# Patient Record
Sex: Female | Born: 1965 | Race: White | Hispanic: No | Marital: Single | Smoking: Never smoker
Health system: Southern US, Community
[De-identification: ages and names within clinical notes are randomized; demographics above are authoritative.]

## PROBLEM LIST (undated history)

## (undated) DIAGNOSIS — D649 Anemia, unspecified: Secondary | ICD-10-CM

## (undated) DIAGNOSIS — E8881 Metabolic syndrome: Secondary | ICD-10-CM

## (undated) DIAGNOSIS — E88819 Insulin resistance, unspecified: Secondary | ICD-10-CM

## (undated) DIAGNOSIS — A6920 Lyme disease, unspecified: Secondary | ICD-10-CM

## (undated) DIAGNOSIS — E785 Hyperlipidemia, unspecified: Secondary | ICD-10-CM

## (undated) DIAGNOSIS — F419 Anxiety disorder, unspecified: Secondary | ICD-10-CM

## (undated) DIAGNOSIS — E559 Vitamin D deficiency, unspecified: Secondary | ICD-10-CM

## (undated) HISTORY — DX: Metabolic syndrome: E88.81

## (undated) HISTORY — DX: Anemia, unspecified: D64.9

## (undated) HISTORY — DX: Vitamin D deficiency, unspecified: E55.9

## (undated) HISTORY — DX: Anxiety disorder, unspecified: F41.9

## (undated) HISTORY — DX: Hyperlipidemia, unspecified: E78.5

## (undated) HISTORY — PX: THERAPEUTIC ABORTION: SHX798

## (undated) HISTORY — DX: Insulin resistance, unspecified: E88.819

## (undated) HISTORY — DX: Lyme disease, unspecified: A69.20

---

## 1999-07-17 ENCOUNTER — Other Ambulatory Visit: Admission: RE | Admit: 1999-07-17 | Discharge: 1999-07-17 | Payer: Self-pay | Admitting: Obstetrics and Gynecology

## 2000-07-20 ENCOUNTER — Other Ambulatory Visit: Admission: RE | Admit: 2000-07-20 | Discharge: 2000-07-20 | Payer: Self-pay | Admitting: Obstetrics and Gynecology

## 2001-07-30 ENCOUNTER — Other Ambulatory Visit: Admission: RE | Admit: 2001-07-30 | Discharge: 2001-07-30 | Payer: Self-pay | Admitting: Obstetrics and Gynecology

## 2002-08-18 ENCOUNTER — Other Ambulatory Visit: Admission: RE | Admit: 2002-08-18 | Discharge: 2002-08-18 | Payer: Self-pay | Admitting: Obstetrics and Gynecology

## 2004-01-03 ENCOUNTER — Other Ambulatory Visit: Admission: RE | Admit: 2004-01-03 | Discharge: 2004-01-03 | Payer: Self-pay | Admitting: Obstetrics and Gynecology

## 2005-06-12 ENCOUNTER — Other Ambulatory Visit: Admission: RE | Admit: 2005-06-12 | Discharge: 2005-06-12 | Payer: Self-pay | Admitting: Obstetrics and Gynecology

## 2006-09-18 ENCOUNTER — Other Ambulatory Visit: Admission: RE | Admit: 2006-09-18 | Discharge: 2006-09-18 | Payer: Self-pay | Admitting: Obstetrics and Gynecology

## 2007-10-25 ENCOUNTER — Other Ambulatory Visit: Admission: RE | Admit: 2007-10-25 | Discharge: 2007-10-25 | Payer: Self-pay | Admitting: Obstetrics and Gynecology

## 2010-01-23 ENCOUNTER — Ambulatory Visit: Payer: Self-pay | Admitting: Women's Health

## 2010-02-07 ENCOUNTER — Ambulatory Visit: Payer: Self-pay | Admitting: Women's Health

## 2010-02-07 ENCOUNTER — Other Ambulatory Visit: Admission: RE | Admit: 2010-02-07 | Discharge: 2010-02-07 | Payer: Self-pay | Admitting: Obstetrics and Gynecology

## 2010-05-22 ENCOUNTER — Ambulatory Visit
Admission: RE | Admit: 2010-05-22 | Discharge: 2010-05-22 | Payer: Self-pay | Source: Home / Self Care | Attending: Women's Health | Admitting: Women's Health

## 2010-12-11 ENCOUNTER — Other Ambulatory Visit: Payer: Self-pay | Admitting: Internal Medicine

## 2010-12-11 ENCOUNTER — Ambulatory Visit (INDEPENDENT_AMBULATORY_CARE_PROVIDER_SITE_OTHER): Payer: BC Managed Care – PPO | Admitting: Internal Medicine

## 2010-12-11 ENCOUNTER — Encounter: Payer: Self-pay | Admitting: Internal Medicine

## 2010-12-11 VITALS — BP 130/82 | HR 76 | Temp 98.1°F | Ht 66.0 in | Wt 132.0 lb

## 2010-12-11 DIAGNOSIS — E8881 Metabolic syndrome: Secondary | ICD-10-CM | POA: Insufficient documentation

## 2010-12-11 DIAGNOSIS — R5383 Other fatigue: Secondary | ICD-10-CM

## 2010-12-11 DIAGNOSIS — L659 Nonscarring hair loss, unspecified: Secondary | ICD-10-CM

## 2010-12-11 DIAGNOSIS — F419 Anxiety disorder, unspecified: Secondary | ICD-10-CM | POA: Insufficient documentation

## 2010-12-11 DIAGNOSIS — F411 Generalized anxiety disorder: Secondary | ICD-10-CM

## 2010-12-11 DIAGNOSIS — D649 Anemia, unspecified: Secondary | ICD-10-CM | POA: Insufficient documentation

## 2010-12-11 DIAGNOSIS — N926 Irregular menstruation, unspecified: Secondary | ICD-10-CM

## 2010-12-11 DIAGNOSIS — E785 Hyperlipidemia, unspecified: Secondary | ICD-10-CM | POA: Insufficient documentation

## 2010-12-11 LAB — CBC WITH DIFFERENTIAL/PLATELET
Basophils Relative: 0 % (ref 0–1)
HCT: 43.3 % (ref 36.0–46.0)
Hemoglobin: 13.9 g/dL (ref 12.0–15.0)
Lymphocytes Relative: 18 % (ref 12–46)
Lymphs Abs: 1.4 10*3/uL (ref 0.7–4.0)
MCHC: 32.1 g/dL (ref 30.0–36.0)
Monocytes Absolute: 0.6 10*3/uL (ref 0.1–1.0)
Monocytes Relative: 7 % (ref 3–12)
Neutro Abs: 5.9 10*3/uL (ref 1.7–7.7)
Neutrophils Relative %: 73 % (ref 43–77)
RBC: 4.63 MIL/uL (ref 3.87–5.11)
WBC: 8 10*3/uL (ref 4.0–10.5)

## 2010-12-11 LAB — COMPREHENSIVE METABOLIC PANEL
AST: 21 U/L (ref 0–37)
Albumin: 4.6 g/dL (ref 3.5–5.2)
Alkaline Phosphatase: 55 U/L (ref 39–117)
Calcium: 9.5 mg/dL (ref 8.4–10.5)
Chloride: 103 mEq/L (ref 96–112)
Glucose, Bld: 84 mg/dL (ref 70–99)
Potassium: 4.2 mEq/L (ref 3.5–5.3)
Sodium: 140 mEq/L (ref 135–145)
Total Protein: 7.6 g/dL (ref 6.0–8.3)

## 2010-12-11 LAB — T4, FREE: Free T4: 1.21 ng/dL (ref 0.80–1.80)

## 2010-12-11 LAB — T3, FREE: T3, Free: 3.3 pg/mL (ref 2.3–4.2)

## 2010-12-11 NOTE — Progress Notes (Signed)
Subjective:    Patient ID: Andrea Francis, female    DOB: [birth year], 45 y.o.   MRN: [id number]  HPI  New pt for first visit.  I spent 45 minutes with this pt.  Former pt of Dr. Benjamine Mola??? Deboraha Sprang on [address] and has seen Harriett Sine young at [facility] and has seen Dr. Sharl Ma at [facility].  Pt has multiple  longstanding vague symptoms but the predominant complaint is that "I shouldn't have this much fatigue"  She denies muscle discomfort .  She was evaluated by an MD in [place] approx 5 years ago and told she had insulin resistance, treated with Metformin but could not tolerate medication.  She has had hair loss off and on, irregular menses off and on, no rashes.  Menses have been controlled with Junel by Maryelizabeth Rowan    Her mother was recently diagnosed with Lupus she reports.  She has taken herself off of gluten foods for the past 4 months and states she feels better.  She denies GI symptoms with dairy.  She is on Xanax for anxiety and has occ depression.  She denies suicidality.  Sleep is fine  She lives alone with a "bunch of redneck neighbors"  She has had 2 pregnancies which were terminated for reasons "I do not wish to go into".  Weight has been stable of late.  She is in sales Allergies  Allergen Reactions  . Wellbutrin (Bupropion Hcl) Hives   Past Medical History  Diagnosis Date  . Insulin resistance   . Anemia     per pt report  . Anxiety   . Hyperlipidemia    Past Surgical History  Procedure Date  . Therapeutic abortion     x2   History   Social History  . Marital Status: Single    Spouse Name: N/A    Number of Children: N/A  . Years of Education: N/A   Occupational History  . Not on file.   Social History Main Topics  . Smoking status: Never Smoker   . Smokeless tobacco: Never Used  . Alcohol Use: 2.5 oz/week    5 drink(s) per week  . Drug Use: No  . Sexually Active: Yes   Other Topics Concern  . Not on file   Social History Narrative  . No narrative on  file   Family History  Problem Relation Age of Onset  . Thyroid disease Mother   . Lupus Mother   . Arthritis Mother     rheumatoid  . Insulin resistance Mother   . Cancer Father     kidney   Patient Active Problem List  Diagnoses  . Anemia  . Anxiety  . Hyperlipidemia  . Insulin resistance        Review of Systems  See hpi     Objective:   Physical Exam  Constitutional: She appears well-developed and well-nourished.  Neck: Neck supple. No thyromegaly present.  Cardiovascular: Normal heart sounds.  Exam reveals no gallop and no friction rub.   No murmur heard. Pulmonary/Chest: She has no wheezes. She has no rales. She exhibits no tenderness.  Musculoskeletal: She exhibits no edema.  Lymphadenopathy:    She has no cervical adenopathy.  Skin: Skin is warm and dry. No rash noted.  Psychiatric: She has a normal mood and affect. Her behavior is normal.          Assessment & Plan:  1)Fatigue  2)  Hair loss  3)  Inrregular menses  Will  refer to Dr. Eustaquio Boyden  4)  Possible gluten intolerance  Symptom complex is longstanding.  With FH of Lupus will check ANA, RF, CBC, Chem 24, TSH, Free t3, Free T4, Vit b12 and lipids  Pt to return in 2 weeks

## 2010-12-11 NOTE — Patient Instructions (Signed)
Continue gluten free diet.  

## 2010-12-12 ENCOUNTER — Encounter: Payer: Self-pay | Admitting: Emergency Medicine

## 2010-12-12 LAB — GLIADIN ANTIBODIES, SERUM: Gliadin IgG: 25.6 U/mL — ABNORMAL HIGH (ref <20)

## 2010-12-12 LAB — TISSUE TRANSGLUTAMINASE, IGA: Tissue Transglutaminase Ab, IgA: 5.5 U/mL (ref <20)

## 2010-12-13 LAB — RETICULIN ANTIBODIES, IGA W TITER

## 2010-12-25 ENCOUNTER — Ambulatory Visit (INDEPENDENT_AMBULATORY_CARE_PROVIDER_SITE_OTHER): Payer: BC Managed Care – PPO | Admitting: Internal Medicine

## 2010-12-25 ENCOUNTER — Encounter: Payer: Self-pay | Admitting: Internal Medicine

## 2010-12-25 VITALS — BP 112/69 | HR 80 | Temp 97.5°F | Wt 134.0 lb

## 2010-12-25 DIAGNOSIS — R5383 Other fatigue: Secondary | ICD-10-CM

## 2010-12-25 DIAGNOSIS — F064 Anxiety disorder due to known physiological condition: Secondary | ICD-10-CM

## 2010-12-25 MED ORDER — ALPRAZOLAM 0.25 MG PO TABS: 0.2500 mg | ORAL_TABLET | Freq: Every evening | ORAL | Status: DC | PRN

## 2010-12-25 NOTE — Progress Notes (Signed)
  Subjective:    Patient ID: Andrea Francis, female    DOB: [birth year], 45 y.o.   MRN: [id number]  HPI  Andrea Francis is here to follow up on her fatigue.  See labs  All normal except slight elevation of Gliadin IGg.  She has been on a gluten free diet for the last 6-7 weeks and states she is having more good days lately.  Work has been stressful.  All other labs are normal  Allergies  Allergen Reactions  . Wellbutrin (Bupropion Hcl) Hives   Past Medical History  Diagnosis Date  . Insulin resistance   . Anemia     per pt report  . Anxiety   . Hyperlipidemia    Past Surgical History  Procedure Date  . Therapeutic abortion     x2   History   Social History  . Marital Status: Single    Spouse Name: N/A    Number of Children: N/A  . Years of Education: N/A   Occupational History  . Not on file.   Social History Main Topics  . Smoking status: Never Smoker   . Smokeless tobacco: Never Used  . Alcohol Use: 2.5 oz/week    5 drink(s) per week  . Drug Use: No  . Sexually Active: Yes   Other Topics Concern  . Not on file   Social History Narrative  . No narrative on file   Family History  Problem Relation Age of Onset  . Thyroid disease Mother   . Lupus Mother   . Arthritis Mother     rheumatoid  . Insulin resistance Mother   . Cancer Father     kidney   Patient Active Problem List  Diagnoses  . Anemia  . Anxiety  . Hyperlipidemia  . Insulin resistance   Current Outpatient Prescriptions on File Prior to Visit  Medication Sig Dispense Refill  . norethindrone-ethinyl estradiol (JUNEL FE 1/20) 1-20 MG-MCG per tablet Take 1 tablet by mouth daily.             Review of Systems  See HPI     Objective:   Physical Exam Physical Exam  Nursing note and vitals reviewed.  Constitutional: She is oriented to person, place, and time. She appears well-developed and well-nourished.  HENT:  Head: Normocephalic and atraumatic.  Cardiovascular: Normal rate and  regular rhythm. Exam reveals no gallop and no friction rub.  No murmur heard.  Pulmonary/Chest: Breath sounds normal. She has no wheezes. She has no rales.  Neurological: She is alert and oriented to person, place, and time.  Skin: Skin is warm and dry.  Psychiatric: She has a normal mood and affect. Her behavior is normal.             Assessment & Plan:  1)  Fatigue  Difficult to pinpoint.  I explained gold standard dx for celiac disease is confimend by colon biopsy when she has been on a gluten diet.  She believes that gluten free diet has been making her feel much better and will continue  2)  Anxiety:  Will refill Xanax 0.25 mg # 10  Retrun prn

## 2011-01-01 ENCOUNTER — Encounter: Payer: Self-pay | Admitting: Internal Medicine

## 2011-01-13 ENCOUNTER — Ambulatory Visit (INDEPENDENT_AMBULATORY_CARE_PROVIDER_SITE_OTHER): Payer: BC Managed Care – PPO | Admitting: Internal Medicine

## 2011-01-13 ENCOUNTER — Encounter: Payer: Self-pay | Admitting: Internal Medicine

## 2011-01-13 VITALS — BP 114/73 | HR 73 | Temp 97.7°F | Resp 16 | Ht 66.0 in | Wt 137.0 lb

## 2011-01-13 DIAGNOSIS — J329 Chronic sinusitis, unspecified: Secondary | ICD-10-CM

## 2011-01-13 MED ORDER — AZITHROMYCIN 250 MG PO TABS: ORAL_TABLET | ORAL | Status: AC

## 2011-01-13 NOTE — Patient Instructions (Signed)
Take med after 48 hours

## 2011-01-13 NOTE — Progress Notes (Signed)
Subjective:    Patient ID: Andrea Francis, female    DOB: [birth year], 45 y.o.   MRN: [id number]  HPI  Andrea Francis is here because she feels she has sinusitis.  [place] and states she has nasal congestion, ear pressure L.> R, and swollen lymph node in neck.  No fever, no cough no lchest paiin  Allergies  Allergen Reactions  . Wellbutrin (Bupropion Hcl) Hives   Past Medical History  Diagnosis Date  . Insulin resistance   . Anemia     per pt report  . Anxiety   . Hyperlipidemia    Past Surgical History  Procedure Date  . Therapeutic abortion     x2   History   Social History  . Marital Status: Single    Spouse Name: N/A    Number of Children: N/A  . Years of Education: N/A   Occupational History  . Not on file.   Social History Main Topics  . Smoking status: Never Smoker   . Smokeless tobacco: Never Used  . Alcohol Use: 2.5 oz/week    5 drink(s) per week  . Drug Use: No  . Sexually Active: Yes   Other Topics Concern  . Not on file   Social History Narrative  . No narrative on file   Family History  Problem Relation Age of Onset  . Thyroid disease Mother   . Lupus Mother   . Arthritis Mother     rheumatoid  . Insulin resistance Mother   . Cancer Father     kidney   Patient Active Problem List  Diagnoses  . Anemia  . Anxiety  . Hyperlipidemia  . Insulin resistance   Current Outpatient Prescriptions on File Prior to Visit  Medication Sig Dispense Refill  . ALPRAZolam (XANAX) 0.25 MG tablet Take 1 tablet (0.25 mg total) by mouth at bedtime as needed.  10 tablet  0  . norethindrone-ethinyl estradiol (JUNEL FE 1/20) 1-20 MG-MCG per tablet Take 1 tablet by mouth daily.                Review of Systems See HPI    Objective:   Physical Exam Physical Exam  Constitutional: She is oriented to person, place, and time. She appears well-developed and well-nourished. She is cooperative.  HENT:  Head: Normocephalic and  atraumatic.  Right Ear: A middle ear effusion is present.  Left Ear: A middle ear effusion is present.  Nose: Mucosal edema present. Right sinus exhibits maxillary sinus tenderness. Left sinus exhibits maxillary sinus tenderness.  Mouth/Throat: Posterior oropharyngeal erythema present.  Serous effusion bilaterally  Eyes: Conjunctivae and EOM are normal. Pupils are equal, round, and reactive to light.  Neck: Neck supple. Carotid bruit is not present. No mass present.  Cardiovascular: Regular rhythm, normal heart sounds, intact distal pulses and normal pulses. Exam reveals no gallop and no friction rub.  No murmur heard.  Pulmonary/Chest: Breath sounds normal. She has no wheezes. She has no rhonchi. She has no rales.  Neurological: She is alert and oriented to person, place, and time.  Skin: Skin is warm and dry. No abrasion, no bruising, no ecchymosis and no rash noted. No cyanosis. Nails show no clubbing.  Psychiatric: She has a normal mood and affect. Her speech is normal and behavior is normal.         Assessment & Plan:  1) probable sinusitis  This may be viral but will give Z pack and pt instructed to wait  48 hrs before filling .   Delsym prn if developes a cough. Advil prn for [employer]

## 2011-03-18 ENCOUNTER — Other Ambulatory Visit: Payer: Self-pay | Admitting: Unknown Physician Specialty

## 2011-03-18 DIAGNOSIS — R1013 Epigastric pain: Secondary | ICD-10-CM

## 2011-03-20 ENCOUNTER — Ambulatory Visit
Admission: RE | Admit: 2011-03-20 | Discharge: 2011-03-20 | Disposition: A | Discharge status: Home / Self Care | Payer: BC Managed Care – PPO | Source: Ambulatory Visit | Attending: Unknown Physician Specialty | Admitting: Unknown Physician Specialty

## 2011-03-20 ENCOUNTER — Other Ambulatory Visit: Payer: BC Managed Care – PPO

## 2011-03-20 DIAGNOSIS — R1013 Epigastric pain: Secondary | ICD-10-CM

## 2011-04-13 ENCOUNTER — Encounter: Payer: Self-pay | Admitting: Internal Medicine

## 2011-06-02 ENCOUNTER — Encounter: Payer: Self-pay | Admitting: Internal Medicine

## 2011-06-02 DIAGNOSIS — K219 Gastro-esophageal reflux disease without esophagitis: Secondary | ICD-10-CM | POA: Insufficient documentation

## 2012-01-06 ENCOUNTER — Encounter: Payer: Self-pay | Admitting: Internal Medicine

## 2012-01-06 ENCOUNTER — Ambulatory Visit (INDEPENDENT_AMBULATORY_CARE_PROVIDER_SITE_OTHER): Payer: BC Managed Care – PPO | Admitting: Internal Medicine

## 2012-01-06 VITALS — BP 105/72 | HR 72 | Temp 97.5°F | Resp 16 | Ht 66.0 in | Wt 142.0 lb

## 2012-01-06 DIAGNOSIS — J329 Chronic sinusitis, unspecified: Secondary | ICD-10-CM

## 2012-01-06 DIAGNOSIS — F411 Generalized anxiety disorder: Secondary | ICD-10-CM

## 2012-01-06 DIAGNOSIS — F419 Anxiety disorder, unspecified: Secondary | ICD-10-CM

## 2012-01-06 MED ORDER — ALPRAZOLAM 0.25 MG PO TABS: 0.2500 mg | ORAL_TABLET | Freq: Every evening | ORAL | Status: DC | PRN

## 2012-01-06 MED ORDER — AZITHROMYCIN 250 MG PO TABS: ORAL_TABLET | ORAL | Status: AC

## 2012-01-06 NOTE — Progress Notes (Signed)
Subjective:    Patient ID: Andrea Francis, female    DOB: [birth year], 46 y.o.   MRN: [id number]  HPI Andrea Francis is here for acute visit.  1 week facial sinus pain.  Occasionally green nasal discharge.  No documented fever no cough or chest pain.  Occasional headache  Xanax helps with anxiety.  Uses rarely  Allergies  Allergen Reactions  . Wellbutrin (Bupropion Hcl) Hives  . Terbinafine Hcl Rash   Past Medical History  Diagnosis Date  . Insulin resistance   . Anemia     per pt report  . Anxiety   . Hyperlipidemia    Past Surgical History  Procedure Date  . Therapeutic abortion     x2   History   Social History  . Marital Status: Single    Spouse Name: N/A    Number of Children: N/A  . Years of Education: N/A   Occupational History  . Not on file.   Social History Main Topics  . Smoking status: Never Smoker   . Smokeless tobacco: Never Used  . Alcohol Use: 2.5 oz/week    5 drink(s) per week  . Drug Use: No  . Sexually Active: Yes   Other Topics Concern  . Not on file   Social History Narrative  . No narrative on file   Family History  Problem Relation Age of Onset  . Thyroid disease Mother   . Lupus Mother   . Arthritis Mother     rheumatoid  . Insulin resistance Mother   . Cancer Father     kidney   Patient Active Problem List  Diagnosis  . Anemia  . Anxiety  . Hyperlipidemia  . Insulin resistance  . GERD (gastroesophageal reflux disease)   Current Outpatient Prescriptions on File Prior to Visit  Medication Sig Dispense Refill  . ALPRAZolam (XANAX) 0.25 MG tablet Take 1 tablet (0.25 mg total) by mouth at bedtime as needed.  10 tablet  0  . fexofenadine-pseudoephedrine (ALLEGRA-D 24) 180-240 MG per 24 hr tablet Take 1 tablet by mouth daily.           Review of Systems    see HPI Objective:   Physical Exam Physical Exam  Constitutional: She is oriented to person, place, and time. She appears well-developed and well-nourished. She is  cooperative.  HENT:  Head: Normocephalic and atraumatic.  Right Ear: A middle ear effusion is present.  Left Ear: A middle ear effusion is present.  Nose: Mucosal edema present. Right sinus exhibits maxillary sinus tenderness. Left sinus exhibits maxillary sinus tenderness.  Mouth/Throat: Posterior oropharyngeal erythema present.  Serous effusion bilaterally  Eyes: Conjunctivae and EOM are normal. Pupils are equal, round, and reactive to light.  Neck: Neck supple. Carotid bruit is not present. No mass present.  Cardiovascular: Regular rhythm, normal heart sounds, intact distal pulses and normal pulses. Exam reveals no gallop and no friction rub.  No murmur heard.  Pulmonary/Chest: Breath sounds normal. She has no wheezes. She has no rhonchi. She has no rales.  Neurological: She is alert and oriented to person, place, and time.  Skin: Skin is warm and dry. No abrasion, no bruising, no ecchymosis and no rash noted. No cyanosis. Nails show no clubbing.  Psychiatric: She has a normal mood and affect. Her speech is normal and behavior is normal.             Assessment & Plan:  Sinusitis:  Will give Z-pak  Ok for OTC Afrin for 4  days  Anxiety  Refill Xanax 0.25 mg # 10

## 2012-01-06 NOTE — Patient Instructions (Addendum)
See me if not better 

## 2012-02-23 ENCOUNTER — Ambulatory Visit (INDEPENDENT_AMBULATORY_CARE_PROVIDER_SITE_OTHER): Payer: BC Managed Care – PPO | Admitting: Internal Medicine

## 2012-02-23 ENCOUNTER — Encounter: Payer: Self-pay | Admitting: Internal Medicine

## 2012-02-23 VITALS — BP 120/85 | HR 72 | Temp 97.6°F | Resp 18 | Wt 133.0 lb

## 2012-02-23 DIAGNOSIS — L659 Nonscarring hair loss, unspecified: Secondary | ICD-10-CM

## 2012-02-23 DIAGNOSIS — J329 Chronic sinusitis, unspecified: Secondary | ICD-10-CM

## 2012-02-23 DIAGNOSIS — Z0000 Encounter for general adult medical examination without abnormal findings: Secondary | ICD-10-CM

## 2012-02-23 LAB — POCT URINALYSIS DIPSTICK
Bilirubin, UA: NEGATIVE
Glucose, UA: NEGATIVE
Ketones, UA: NEGATIVE
Leukocytes, UA: NEGATIVE

## 2012-02-23 MED ORDER — AMOXICILLIN-POT CLAVULANATE 500-125 MG PO TABS: ORAL_TABLET | ORAL | Status: DC

## 2012-02-23 NOTE — Patient Instructions (Addendum)
Will refer to dermatology

## 2012-02-23 NOTE — Progress Notes (Signed)
Subjective:    Patient ID: Andrea Francis, female    DOB: 1966/03/17, 46 y.o.   MRN: [id number]  HPI Andrea Francis has been having sinus pressure for the past several days.  Nasal congestion.  Exposure to co-workers with infection  Also concerned over hair falling out.  Free T4 normal last year.  Would like to see dermatologist  Allergies  Allergen Reactions  . Wellbutrin (Bupropion Hcl) Hives  . Terbinafine Hcl Rash   Past Medical History  Diagnosis Date  . Insulin resistance   . Anemia     per pt report  . Anxiety   . Hyperlipidemia    Past Surgical History  Procedure Date  . Therapeutic abortion     x2   History   Social History  . Marital Status: Single    Spouse Name: N/A    Number of Children: N/A  . Years of Education: N/A   Occupational History  . Not on file.   Social History Main Topics  . Smoking status: Never Smoker   . Smokeless tobacco: Never Used  . Alcohol Use: 2.5 oz/week    5 drink(s) per week  . Drug Use: No  . Sexually Active: Yes   Other Topics Concern  . Not on file   Social History Narrative  . No narrative on file   Family History  Problem Relation Age of Onset  . Thyroid disease Mother   . Lupus Mother   . Arthritis Mother     rheumatoid  . Insulin resistance Mother   . Cancer Father     kidney   Patient Active Problem List  Diagnosis  . Anemia  . Anxiety  . Hyperlipidemia  . Insulin resistance  . GERD (gastroesophageal reflux disease)   Current Outpatient Prescriptions on File Prior to Visit  Medication Sig Dispense Refill  . ALPRAZolam (XANAX) 0.25 MG tablet Take 1 tablet (0.25 mg total) by mouth at bedtime as needed.  10 tablet  0  . fexofenadine-pseudoephedrine (ALLEGRA-D 24) 180-240 MG per 24 hr tablet Take 1 tablet by mouth daily.             Review of Systems see HPI   Objective:   Physical Exam Physical Exam  Constitutional: She is oriented to person, place, and time. She appears well-developed and  well-nourished. She is cooperative.  HENT:  Head: Normocephalic and atraumatic.  Right Ear: A middle ear effusion is present.  Left Ear: A middle ear effusion is present.  Nose: Mucosal edema present. Right sinus exhibits maxillary sinus tenderness. Left sinus exhibits maxillary sinus tenderness.  Mouth/Throat: Posterior oropharyngeal erythema present.  Serous effusion bilaterally  Eyes: Conjunctivae and EOM are normal. Pupils are equal, round, and reactive to light.  Neck: Neck supple. Carotid bruit is not present. No mass present.  Cardiovascular: Regular rhythm, normal heart sounds, intact distal pulses and normal pulses. Exam reveals no gallop and no friction rub.  No murmur heard.  Pulmonary/Chest: Breath sounds normal. She has no wheezes. She has no rhonchi. She has no rales.  Neurological: She is alert and oriented to person, place, and time.  Skin: Skin is warm and dry. No abrasion, no bruising, no ecchymosis and no rash noted. No cyanosis. Nails show no clubbing.  Psychiatric: She has a normal mood and affect. Her speech is normal and behavior is normal.             Assessment & Plan:  Sinusitis: Augmentin 500 bid for 10 days  [place]  Will give derm referral

## 2012-02-26 NOTE — Addendum Note (Signed)
Addended by: Raechel Chute D on: 02/26/2012 09:15 AM   Modules accepted: Orders

## 2012-03-23 ENCOUNTER — Ambulatory Visit (INDEPENDENT_AMBULATORY_CARE_PROVIDER_SITE_OTHER): Payer: BC Managed Care – PPO | Admitting: Internal Medicine

## 2012-03-23 ENCOUNTER — Encounter: Payer: Self-pay | Admitting: Internal Medicine

## 2012-03-23 ENCOUNTER — Other Ambulatory Visit: Payer: Self-pay | Admitting: Internal Medicine

## 2012-03-23 VITALS — BP 131/80 | HR 81 | Temp 97.0°F | Resp 18 | Wt 135.0 lb

## 2012-03-23 DIAGNOSIS — Z124 Encounter for screening for malignant neoplasm of cervix: Secondary | ICD-10-CM

## 2012-03-23 DIAGNOSIS — N898 Other specified noninflammatory disorders of vagina: Secondary | ICD-10-CM

## 2012-03-23 DIAGNOSIS — R3915 Urgency of urination: Secondary | ICD-10-CM

## 2012-03-23 DIAGNOSIS — Z1151 Encounter for screening for human papillomavirus (HPV): Secondary | ICD-10-CM

## 2012-03-23 LAB — POCT URINALYSIS DIPSTICK
Bilirubin, UA: NEGATIVE
Glucose, UA: NEGATIVE
Leukocytes, UA: NEGATIVE
Nitrite, UA: NEGATIVE

## 2012-03-23 LAB — POCT URINE PREGNANCY: Preg Test, Ur: NEGATIVE

## 2012-03-23 MED ORDER — FLUCONAZOLE 150 MG PO TABS: 150.0000 mg | ORAL_TABLET | Freq: Once | ORAL | Status: DC

## 2012-03-23 MED ORDER — METRONIDAZOLE 500 MG PO TABS: ORAL_TABLET | ORAL | Status: DC

## 2012-03-23 NOTE — Patient Instructions (Addendum)
Call office Monday for results 

## 2012-03-23 NOTE — Progress Notes (Signed)
Subjective:    Patient ID: Andrea Francis, female    DOB: May 20, 1965, 46 y.o.   MRN: [id number]  HPI Latravia is here for an acute visit.   She has burning type vaginal discharge last several days.  She reports she has a new sexual partner for the last month. She is not using condoms,  Partner has had a vasectomy.    Denies itching.  Some urinary urgency no real dysuria.  She reports she has not had a pap smear in years.   She bought an OTC cream for yeast that she only used for one day.   Allergies  Allergen Reactions  . Wellbutrin (Bupropion Hcl) Hives  . Terbinafine Hcl Rash   Past Medical History  Diagnosis Date  . Insulin resistance   . Anemia     per pt report  . Anxiety   . Hyperlipidemia    Past Surgical History  Procedure Date  . Therapeutic abortion     x2   History   Social History  . Marital Status: Single    Spouse Name: N/A    Number of Children: N/A  . Years of Education: N/A   Occupational History  . Not on file.   Social History Main Topics  . Smoking status: Never Smoker   . Smokeless tobacco: Never Used  . Alcohol Use: 2.5 oz/week    5 drink(s) per week  . Drug Use: No  . Sexually Active: Yes   Other Topics Concern  . Not on file   Social History Narrative  . No narrative on file   Family History  Problem Relation Age of Onset  . Thyroid disease Mother   . Lupus Mother   . Arthritis Mother     rheumatoid  . Insulin resistance Mother   . Cancer Father     kidney   Patient Active Problem List  Diagnosis  . Anemia  . Anxiety  . Hyperlipidemia  . Insulin resistance  . GERD (gastroesophageal reflux disease)   Current Outpatient Prescriptions on File Prior to Visit  Medication Sig Dispense Refill  . ALPRAZolam (XANAX) 0.25 MG tablet Take 1 tablet (0.25 mg total) by mouth at bedtime as needed.  10 tablet  0  . amoxicillin-clavulanate (AUGMENTIN) 500-125 MG per tablet Take one tablet bid for 10 days  20 tablet  0  .  fexofenadine-pseudoephedrine (ALLEGRA-D 24) 180-240 MG per 24 hr tablet Take 1 tablet by mouth daily.             Review of Systems See HPI    Objective:   Physical Exam  Physical Exam  Nursing note and vitals reviewed.  Constitutional: She is oriented to person, place, and time. She appears well-developed and well-nourished.  HENT:  Head: Normocephalic and atraumatic.  Cardiovascular: Normal rate and regular rhythm. Exam reveals no gallop and no friction rub.  No murmur heard.  Pulmonary/Chest: Breath sounds normal. She has no wheezes. She has no rales.  Neurological: She is alert and oriented to person, place, and time. Pelvic.  Nl external genitalia.  Vagina no lesions.  Cervix pap obtained,  Small amount of blood from cervical os.  No adnexal masses or tenderness Skin: Skin is warm and dry.  Psychiatric: She has a normal mood and affect. Her behavior is normal.         Assessment & Plan:  Vaginitis:  Will empirically treat with flagyl 500 mg po bid for 7 days  and empiric  oral diflucan.  Urine preg neg today  Advised no alcohol until 48 hours after completing flagyl  Urinary urgency.  U/A today is normal  Pap today  Further management based on results of above

## 2012-03-25 LAB — WET PREP, GENITAL
Trich, Wet Prep: NONE SEEN
Yeast Wet Prep HPF POC: NONE SEEN

## 2012-03-29 ENCOUNTER — Encounter: Payer: Self-pay | Admitting: *Deleted

## 2012-03-30 ENCOUNTER — Telehealth: Payer: Self-pay | Admitting: *Deleted

## 2012-03-30 NOTE — Telephone Encounter (Signed)
-  pap letter sent to pt home address 

## 2012-04-05 ENCOUNTER — Telehealth: Payer: Self-pay | Admitting: Internal Medicine

## 2012-04-05 NOTE — Telephone Encounter (Signed)
Left message for pt to return call regarding lab results.  

## 2012-04-19 ENCOUNTER — Other Ambulatory Visit: Payer: Self-pay | Admitting: Internal Medicine

## 2012-04-19 ENCOUNTER — Encounter: Payer: Self-pay | Admitting: Internal Medicine

## 2012-04-19 ENCOUNTER — Ambulatory Visit (INDEPENDENT_AMBULATORY_CARE_PROVIDER_SITE_OTHER): Payer: BC Managed Care – PPO | Admitting: Internal Medicine

## 2012-04-19 ENCOUNTER — Telehealth: Payer: Self-pay | Admitting: Internal Medicine

## 2012-04-19 VITALS — BP 116/79 | HR 78 | Temp 97.2°F | Resp 16 | Wt 133.0 lb

## 2012-04-19 DIAGNOSIS — N949 Unspecified condition associated with female genital organs and menstrual cycle: Secondary | ICD-10-CM | POA: Insufficient documentation

## 2012-04-19 DIAGNOSIS — N898 Other specified noninflammatory disorders of vagina: Secondary | ICD-10-CM

## 2012-04-19 DIAGNOSIS — N9489 Other specified conditions associated with female genital organs and menstrual cycle: Secondary | ICD-10-CM | POA: Insufficient documentation

## 2012-04-19 LAB — POCT URINALYSIS DIPSTICK
Bilirubin, UA: NEGATIVE
Ketones, UA: NEGATIVE
Leukocytes, UA: NEGATIVE
pH, UA: 5.5

## 2012-04-19 NOTE — Progress Notes (Signed)
Subjective:    Patient ID: Andrea Francis, female    DOB: [birth year], 46 y.o.   MRN: [id number]  HPI Andrea Francis is here for follow up.  She states she did not take her medication (I gave her Diflucan and Flagyl)  Right away.  She has been having vaginal burning and "it hurts to sit at work all day".  She also states she has an appt with Dr. Maryelizabeth Rowan GYN in 3 days on Thursday.  Labs on 11/05  Wet prep neg.  Pap neg HPV neg.  Lab misplaced her GC and Chlamydia specimen  No dysuria or urgency.  No vaginal discharge.  She reports she was using an old RX for metrogel cream and it helped her somewhat  Allergies  Allergen Reactions  . Wellbutrin (Bupropion Hcl) Hives  . Terbinafine Hcl Rash   Past Medical History  Diagnosis Date  . Insulin resistance   . Anemia     per pt report  . Anxiety   . Hyperlipidemia    Past Surgical History  Procedure Date  . Therapeutic abortion     x2   History   Social History  . Marital Status: Single    Spouse Name: N/A    Number of Children: N/A  . Years of Education: N/A   Occupational History  . Not on file.   Social History Main Topics  . Smoking status: Never Smoker   . Smokeless tobacco: Never Used  . Alcohol Use: 2.5 oz/week    5 drink(s) per week  . Drug Use: No  . Sexually Active: Yes   Other Topics Concern  . Not on file   Social History Narrative  . No narrative on file   Family History  Problem Relation Age of Onset  . Thyroid disease Mother   . Lupus Mother   . Arthritis Mother     rheumatoid  . Insulin resistance Mother   . Cancer Father     kidney   Patient Active Problem List  Diagnosis  . Anemia  . Anxiety  . Hyperlipidemia  . Insulin resistance  . GERD (gastroesophageal reflux disease)  . Vaginal burning   Current Outpatient Prescriptions on File Prior to Visit  Medication Sig Dispense Refill  . ALPRAZolam (XANAX) 0.25 MG tablet Take 1 tablet (0.25 mg total) by mouth at bedtime as needed.  10 tablet   0  . fexofenadine-pseudoephedrine (ALLEGRA-D 24) 180-240 MG per 24 hr tablet Take 1 tablet by mouth daily.        Marland Kitchen amoxicillin-clavulanate (AUGMENTIN) 500-125 MG per tablet Take one tablet bid for 10 days  20 tablet  0  . fluconazole (DIFLUCAN) 150 MG tablet Take 1 tablet (150 mg total) by mouth once.  1 tablet  0  . metroNIDAZOLE (FLAGYL) 500 MG tablet One po bid for 7 days.  Do not drink ETOH  While taking med  14 tablet  0      Review of Systems See HPI    Objective:   Physical Exam Physical Exam  Nursing note and vitals reviewed.  Constitutional: She is oriented to person, place, and time. She appears well-developed and well-nourished.  HENT:  Head: Normocephalic and atraumatic.  Cardiovascular: Normal rate and regular rhythm. Exam reveals no gallop and no friction rub.  No murmur heard.  Pulmonary/Chest: Breath sounds normal. She has no wheezes. She has no rales. GYN:  Normal external genitalia .  Vagina no lesions.  Cervix visualized  GC and Chlamydia  obtained Neurological: She is alert and oriented to person, place, and time.  Skin: Skin is warm and dry.  Psychiatric: She has a normal mood and affect. Her behavior is normal.              Assessment & Plan:  Vagina burning.  I explained to pt that I do not see any physical reason for her symptoms..  U/A is negative today.  Will send  GC and Chlamydia.  I gave pt copy of pap smear result and wet prep results.    Empirically gave sample of luvena  Anti- itch creme and luvena lubricant  I counseled her it is very important to keep her appt with GYN  Dr. Maryelizabeth Rowan

## 2012-04-19 NOTE — Telephone Encounter (Signed)
PT STATES SHE HAS NOT HEARD FROM ANYONE ABOUT THE RESULTS OF HER TESTS.. SHE STATES SHE WOULD LIKE TO  HEAR FROM SOMEONE BC SHE IS HAVING SOME OF THE SAME PROBLEMS... PLEASE GIVE PT A CALL... SHE CALLED [phone] AM THIS MORNING

## 2012-04-19 NOTE — Addendum Note (Signed)
Addended by: Mathews Robinsons on: 04/19/2012 11:41 AM   Modules accepted: Orders

## 2012-04-19 NOTE — Patient Instructions (Addendum)
Keep appt with Dr. Artist Beach MD this week

## 2012-04-20 ENCOUNTER — Other Ambulatory Visit: Payer: Self-pay | Admitting: Internal Medicine

## 2012-04-21 ENCOUNTER — Telehealth: Payer: Self-pay | Admitting: *Deleted

## 2012-04-21 LAB — WET PREP, GENITAL
Clue Cells Wet Prep HPF POC: NONE SEEN
Trich, Wet Prep: NONE SEEN

## 2012-04-21 NOTE — Telephone Encounter (Signed)
Return call

## 2012-04-21 NOTE — Telephone Encounter (Signed)
Called pt with recent - results

## 2012-04-21 NOTE — Telephone Encounter (Signed)
Results mailed to pt home address.

## 2012-04-21 NOTE — Telephone Encounter (Signed)
Awaiting return call

## 2012-04-22 ENCOUNTER — Telehealth: Payer: Self-pay | Admitting: Internal Medicine

## 2012-04-22 ENCOUNTER — Encounter: Payer: BC Managed Care – PPO | Admitting: Women's Health

## 2012-04-22 ENCOUNTER — Telehealth: Payer: Self-pay | Admitting: *Deleted

## 2012-04-22 NOTE — Telephone Encounter (Signed)
Message copied by Mathews Robinsons on Thu Apr 22, 2012  3:17 PM ------      Message from: Raechel Chute D      Created: Wed Apr 21, 2012  1:24 PM       Karen Kitchens             Call pt and let her know that her GC and chlamydia tests are negative along with yeast and trichomonas.             Counsel her to be sure to keep her appt with GYN Dr. Maple Hudson            Thanks

## 2012-04-22 NOTE — Telephone Encounter (Signed)
Message copied by Mathews Robinsons on Thu Apr 22, 2012  3:23 PM ------      Message from: Raechel Chute D      Created: Wed Apr 21, 2012  1:24 PM       Karen Kitchens             Call pt and let her know that her GC and chlamydia tests are negative along with yeast and trichomonas.             Counsel her to be sure to keep her appt with GYN Dr. Maple Hudson            Thanks

## 2012-04-22 NOTE — Telephone Encounter (Signed)
Pt states that she cancelled her appt with Dr Maple Hudson and wishes Dr Constance Goltz to call something in

## 2012-04-22 NOTE — Telephone Encounter (Signed)
Spoke with pt .  She is very combative over the phone and yelling.  I cannot speak with her in a normal tone to help her with her clinical issues  Will have Berenice Bouton speak with pt

## 2012-04-22 NOTE — Telephone Encounter (Signed)
Spoke with Dr Constance Goltz regarding [employer] pt will need to set up an appt with MD for [facility] left message

## 2012-04-22 NOTE — Telephone Encounter (Signed)
Pt was called to make a 15 min appointment to start birth control meds.. Pt called back and spoke with Ardenia.. Pt was told the reason why Bobbie, the nurse was calling... Pt started yelling that she was not going set up an appointment.. She states does not like the way the practice is run; she was furious... Per pt dr deb was told from the beginning she wanted to be on birth control.. She was very loud rude and pt states she wanted either Bobbie or Dr. Reece Levy call her back in the next 30 min (325 pm).. Pt states it would be in their best favor to call her back bc they do not want her to come up here.. Pt was told I was just the messenger and I apologize to her that she felt this way about practice and the whole situation... Ad

## 2012-04-26 ENCOUNTER — Encounter: Payer: Self-pay | Admitting: Internal Medicine

## 2012-12-16 ENCOUNTER — Ambulatory Visit (INDEPENDENT_AMBULATORY_CARE_PROVIDER_SITE_OTHER): Payer: BC Managed Care – PPO | Admitting: Women's Health

## 2012-12-16 ENCOUNTER — Encounter: Payer: Self-pay | Admitting: Women's Health

## 2012-12-16 VITALS — BP 114/70 | Ht 66.0 in | Wt 130.0 lb

## 2012-12-16 DIAGNOSIS — F411 Generalized anxiety disorder: Secondary | ICD-10-CM

## 2012-12-16 DIAGNOSIS — N926 Irregular menstruation, unspecified: Secondary | ICD-10-CM

## 2012-12-16 DIAGNOSIS — Z01419 Encounter for gynecological examination (general) (routine) without abnormal findings: Secondary | ICD-10-CM

## 2012-12-16 LAB — CBC WITH DIFFERENTIAL/PLATELET
Basophils Absolute: 0 10*3/uL (ref 0.0–0.1)
Basophils Relative: 0 % (ref 0–1)
Eosinophils Absolute: 0.1 10*3/uL (ref 0.0–0.7)
Eosinophils Relative: 2 % (ref 0–5)
HCT: 40.2 % (ref 36.0–46.0)
MCH: 30.2 pg (ref 26.0–34.0)
MCHC: 34.6 g/dL (ref 30.0–36.0)
MCV: 87.2 fL (ref 78.0–100.0)
Monocytes Absolute: 0.6 10*3/uL (ref 0.1–1.0)
Platelets: 284 10*3/uL (ref 150–400)
RDW: 12.9 % (ref 11.5–15.5)
WBC: 6.8 10*3/uL (ref 4.0–10.5)

## 2012-12-16 LAB — THYROID PANEL WITH TSH
T3 Uptake: 34.2 % (ref 22.5–37.0)
TSH: 0.994 u[IU]/mL (ref 0.350–4.500)

## 2012-12-16 MED ORDER — ALPRAZOLAM 0.25 MG PO TABS: 0.2500 mg | ORAL_TABLET | Freq: Every evening | ORAL | Status: DC | PRN

## 2012-12-16 NOTE — Patient Instructions (Addendum)

## 2012-12-16 NOTE — Progress Notes (Signed)
Andrea Francis 1965-06-18 [id number]  History:    The patient presents for annual exam.  Irregular cycles, every 5 weeks to 3 months. Has gone as long as 6 months in the past.  Had been on birth control pills for many years with regular monthly cycle until 2012. Normal Pap history. Never has had a mammogram and refuses. Depression with anxiety on no medication, Celexa, lamictal in the past. Uses occasional Xanax.  Past medical history, past surgical history, family history and social history were all reviewed and documented in the EPIC chart.  History of  insulin resistance, currently eating a healthier diet, gluten and wheat free and states feels better. Mother, diabetes, hypertension. Father kidney cancer.  ROS:  A  ROS was performed and pertinent positives and negatives are included in the history.  Exam:  Filed Vitals:   12/16/12 1009  BP: 114/70    General appearance:  Normal Head/Neck:  Normal, without cervical or supraclavicular adenopathy. Thyroid:  Symmetrical, normal in size, without palpable masses or nodularity. Respiratory  Effort:  Normal  Auscultation:  Clear without wheezing or rhonchi Cardiovascular  Auscultation:  Regular rate, without rubs, murmurs or gallops  Edema/varicosities:  Not grossly evident Abdominal  Soft,nontender, without masses, guarding or rebound.  Liver/spleen:  No organomegaly noted  Hernia:  None appreciated  Skin  Inspection:  Grossly normal  Palpation:  Grossly normal Neurologic/psychiatric  Orientation:  Normal with appropriate conversation.  Mood/affect:  Normal  Genitourinary    Breasts: Examined lying and sitting.     Right: Without masses, retractions, discharge or axillary adenopathy.     Left: Without masses, retractions, discharge or axillary adenopathy.   Inguinal/mons:  Normal without inguinal adenopathy  External genitalia:  Normal  BUS/Urethra/Skene's glands:  Normal  Bladder:  Normal  Vagina:  Normal  Cervix:   Normal  Uterus:   normal in size, shape and contour.  Midline and mobile  Adnexa/parametria:     Rt: Without masses or tenderness.   Lt: Without masses or tenderness.  Anus and perineum: Normal  Digital rectal exam: Normal sphincter tone without palpated masses or tenderness  Assessment/Plan:  47 y.o.DWF G2P0  for annual exam with irregular cycles.  Anxiety/depression on no medication Irregular cycles with history of insulin resistance  Plan: Options reviewed, declines OCPs for cycle control, report cycles light for one to 3 days. Instructed to call or return to office a cycle spaces greater than 3 months. Condoms encouraged if sexually active. Counseling encouraged, denies need at this time.  Xanax 0.25 prescription, the addictive properties reviewed. SBE's, encouraged annual screening mammogram, reviewed benefits. Encouraged to increase regular exercise, calcium rich diet, vitamin D 1000 daily encouraged. CBC, glucose, TSH, prolactin, UA, Pap normal 03/2012 at primary care. New screening guidelines reviewed.  Harrington Challenger [facility], 12:21 PM 12/16/2012

## 2012-12-17 LAB — URINALYSIS W MICROSCOPIC + REFLEX CULTURE
Casts: NONE SEEN
Glucose, UA: NEGATIVE mg/dL
Leukocytes, UA: NEGATIVE
Specific Gravity, Urine: 1.01 (ref 1.005–1.030)
Squamous Epithelial / LPF: NONE SEEN
pH: 6.5 (ref 5.0–8.0)

## 2012-12-17 LAB — PROLACTIN: Prolactin: 9.2 ng/mL

## 2013-07-18 ENCOUNTER — Emergency Department (HOSPITAL_BASED_OUTPATIENT_CLINIC_OR_DEPARTMENT_OTHER): Payer: BC Managed Care – PPO

## 2013-07-18 ENCOUNTER — Emergency Department (HOSPITAL_BASED_OUTPATIENT_CLINIC_OR_DEPARTMENT_OTHER)
Admission: EM | Admit: 2013-07-18 | Discharge: 2013-07-18 | Disposition: A | Discharge status: Home / Self Care | Payer: BC Managed Care – PPO | Attending: Emergency Medicine | Admitting: Emergency Medicine

## 2013-07-18 ENCOUNTER — Encounter (HOSPITAL_BASED_OUTPATIENT_CLINIC_OR_DEPARTMENT_OTHER): Payer: Self-pay | Admitting: Emergency Medicine

## 2013-07-18 DIAGNOSIS — Y929 Unspecified place or not applicable: Secondary | ICD-10-CM | POA: Insufficient documentation

## 2013-07-18 DIAGNOSIS — J3489 Other specified disorders of nose and nasal sinuses: Secondary | ICD-10-CM | POA: Insufficient documentation

## 2013-07-18 DIAGNOSIS — H6990 Unspecified Eustachian tube disorder, unspecified ear: Secondary | ICD-10-CM | POA: Insufficient documentation

## 2013-07-18 DIAGNOSIS — Z3202 Encounter for pregnancy test, result negative: Secondary | ICD-10-CM | POA: Insufficient documentation

## 2013-07-18 DIAGNOSIS — H6980 Other specified disorders of Eustachian tube, unspecified ear: Secondary | ICD-10-CM

## 2013-07-18 DIAGNOSIS — Z862 Personal history of diseases of the blood and blood-forming organs and certain disorders involving the immune mechanism: Secondary | ICD-10-CM | POA: Insufficient documentation

## 2013-07-18 DIAGNOSIS — Z79899 Other long term (current) drug therapy: Secondary | ICD-10-CM | POA: Insufficient documentation

## 2013-07-18 DIAGNOSIS — W1809XA Striking against other object with subsequent fall, initial encounter: Secondary | ICD-10-CM | POA: Insufficient documentation

## 2013-07-18 DIAGNOSIS — S0990XA Unspecified injury of head, initial encounter: Secondary | ICD-10-CM | POA: Insufficient documentation

## 2013-07-18 DIAGNOSIS — Z8639 Personal history of other endocrine, nutritional and metabolic disease: Secondary | ICD-10-CM | POA: Insufficient documentation

## 2013-07-18 DIAGNOSIS — F411 Generalized anxiety disorder: Secondary | ICD-10-CM | POA: Insufficient documentation

## 2013-07-18 DIAGNOSIS — Y9389 Activity, other specified: Secondary | ICD-10-CM | POA: Insufficient documentation

## 2013-07-18 LAB — PREGNANCY, URINE: PREG TEST UR: NEGATIVE

## 2013-07-18 MED ORDER — CETIRIZINE-PSEUDOEPHEDRINE ER 5-120 MG PO TB12: 1.0000 | ORAL_TABLET | Freq: Two times a day (BID) | ORAL | Status: DC

## 2013-07-18 MED ORDER — FLUTICASONE PROPIONATE 50 MCG/ACT NA SUSP: 2.0000 | Freq: Every day | NASAL | Status: DC

## 2013-07-18 NOTE — ED Notes (Signed)
Pt reports fall from stating Friday past striking head against concrete denies LOC, [facility] for contusions felt better yesterday and came ED tonight d/t trouble hearing and pressure in ears bilateral denies current pain

## 2013-07-18 NOTE — ED Provider Notes (Signed)
CSN: [id number]     Arrival date & time 07/18/13  4098 History   First MD Initiated Contact with Patient 07/18/13 [phone]     Chief Complaint  Patient presents with  . Fall     (Consider location/radiation/quality/duration/timing/severity/associated sxs/prior Treatment) Patient is a 48 y.o. female presenting with fall. The history is provided by the patient. No language interpreter was used.  Fall This is a new problem. The current episode started more than 2 days ago. The problem occurs constantly. The problem has not changed since onset.Pertinent negatives include no chest pain and no abdominal pain. Associated symptoms comments: Ear pain and muffled sound in the left ear. Nothing aggravates the symptoms. Nothing relieves the symptoms. She has tried acetaminophen for the symptoms. The treatment provided no relief.    Past Medical History  Diagnosis Date  . Insulin resistance   . Anemia     per pt report  . Anxiety   . Hyperlipidemia    Past Surgical History  Procedure Laterality Date  . Therapeutic abortion      x2   Family History  Problem Relation Age of Onset  . Thyroid disease Mother   . Lupus Mother     unsure  . Arthritis Mother     rheumatoid  . Insulin resistance Mother   . Seizures Mother   . Cancer Father     kidney   History  Substance Use Topics  . Smoking status: Never Smoker   . Smokeless tobacco: Never Used  . Alcohol Use: 2.5 oz/week    5 drink(s) per week   OB History   Grav Para Term Preterm Abortions TAB SAB Ect Mult Living   1    1          Review of Systems  HENT: Positive for congestion and ear pain. Negative for ear discharge.   Cardiovascular: Negative for chest pain.  Gastrointestinal: Negative for abdominal pain.  All other systems reviewed and are negative.      Allergies  Wellbutrin and Terbinafine hcl  Home Medications   Current Outpatient Rx  Name  Route  Sig  Dispense  Refill  . ALPRAZolam (XANAX) 0.25 MG tablet    Oral   Take 1 tablet (0.25 mg total) by mouth at bedtime as needed.   30 tablet   3   . fexofenadine-pseudoephedrine (ALLEGRA-D 24) 180-240 MG per 24 hr tablet   Oral   Take 1 tablet by mouth daily.            BP 147/81  Pulse 96  Temp(Src) 98.7 F (37.1 C) (Oral)  Resp 16  SpO2 100%  LMP 06/28/2013 Physical Exam  Constitutional: She appears well-developed and well-nourished. No distress.  HENT:  Head: Normocephalic and atraumatic. Head is without raccoon's eyes and without Battle's sign.  Right Ear: No mastoid tenderness. No hemotympanum.  Left Ear: No mastoid tenderness. No hemotympanum.  Mouth/Throat: Oropharynx is clear and moist.  L TM is rectracted  Eyes: Conjunctivae are normal. Pupils are equal, round, and reactive to light.  Neck: Neck supple. No tracheal deviation present.  Cardiovascular: Normal rate and regular rhythm.   Pulmonary/Chest: Effort normal and breath sounds normal. No respiratory distress. She has no wheezes.  Abdominal: Soft. Bowel sounds are normal. There is no tenderness. There is no rebound and no guarding.  Musculoskeletal: Normal range of motion.  Neurological: She is alert. She has normal reflexes.  Intact steady gait  Skin: Skin is warm and dry.  Psychiatric: She has a normal mood and affect.    ED Course  Procedures (including critical care time) Labs Review Labs Reviewed  PREGNANCY, URINE   Imaging Review No results found.   EKG Interpretation None      MDM   Final diagnoses:  None    Symptoms consistent with eustachian tube dysfunction.  Recommend nasal steroids and zyrtec D and prn follow up with ENT    Uniqua Kihn K Romy Ipock-Rasch, MD 07/18/13 [id number]

## 2013-10-19 ENCOUNTER — Ambulatory Visit (INDEPENDENT_AMBULATORY_CARE_PROVIDER_SITE_OTHER): Payer: BC Managed Care – PPO | Admitting: Women's Health

## 2013-10-19 ENCOUNTER — Encounter: Payer: Self-pay | Admitting: Women's Health

## 2013-10-19 DIAGNOSIS — R5381 Other malaise: Secondary | ICD-10-CM

## 2013-10-19 DIAGNOSIS — R5383 Other fatigue: Principal | ICD-10-CM

## 2013-10-19 NOTE — Progress Notes (Signed)
Patient ID: Andrea Francis, female   DOB: Jul 28, 1965, 48 y.o.   MRN: [id number] Presents with complaint of extreme fatigue, weakness, hair thinning, generally not feeling well for months, questions if it is her thyroid.  10/11/2013 at urgent care normal CBC, comprehensive metabolic panel, and thyroid panel. TSH 0.45. Positive for rheumatoid arthritis factor. Has had some problems with anxiety and depression in the past but states not related.  Reports mother has similar type symptoms of fatigue, weakness. Has a scheduled appointment with [facility] in August, first available for new patient.  Eating gluten free diet with some relief of symptoms. Monthly cycle, not sexually active.  Exam: Appears well, frustrated with lack of diagnosis.  Unexplained fatigue/weakness/hair thinning  Plan: Thyroid antibodies, lupus panel, iodine. Reviewed importance of increased rest, continue multivitamin daily, exercise, leisure, gluten free diet.

## 2013-10-19 NOTE — Patient Instructions (Signed)
Chronic Fatigue Syndrome Chronic Fatigue Syndrome is characterized by extreme fatigue that does not improve with rest. The cause of this condition is unknown.  SYMPTOMS  An unexplained dramatic loss of energy.  Muscle or joint soreness.  Severe weakness.  Frequent headaches.  Fever, sore throat, and swollen lymph glands.  Sleep problems.  Inability to concentrate. Symptoms must usually be present for over 6 months before the diagnosis of chronic fatigue can be made. There is no diagnostic test for this disease. Many other diseases can cause similar symptoms. A complete medical evaluation is needed to be sure you do not have other medical problems causing your symptoms. There is no specific treatment for Chronic Fatigue Syndrome. Cognitive behavioral therapy (similar to counseling) and/or a simple exercise regimen may be beneficial. Get plenty of rest and avoid alcohol and other depressant drugs. Call your caregiver for follow up care as recommended. Document Released: 06/12/2004 Document Revised: 07/28/2011 Document Reviewed: 08/04/2008 [facility] Patient Information 2014 [place], [place].

## 2013-10-20 LAB — THYROID ANTIBODIES

## 2013-10-21 LAB — IODINE, SERUM/PLASMA: Iodine: 45 mcg/L — ABNORMAL LOW (ref 52–109)

## 2013-10-25 LAB — LUPUS ANTICOAGULANT PANEL
DRVVT: 38.5 secs (ref <42.9)
LUPUS ANTICOAGULANT: NOT DETECTED
PTT Lupus Anticoagulant: 35.3 secs (ref 28.0–43.0)

## 2014-03-20 ENCOUNTER — Encounter: Payer: Self-pay | Admitting: Women's Health

## 2014-07-12 ENCOUNTER — Ambulatory Visit: Payer: Self-pay | Admitting: Women's Health

## 2014-08-09 ENCOUNTER — Ambulatory Visit (INDEPENDENT_AMBULATORY_CARE_PROVIDER_SITE_OTHER): Payer: BLUE CROSS/BLUE SHIELD | Admitting: Women's Health

## 2014-08-09 ENCOUNTER — Encounter: Payer: Self-pay | Admitting: Women's Health

## 2014-08-09 ENCOUNTER — Other Ambulatory Visit (HOSPITAL_COMMUNITY)
Admission: RE | Admit: 2014-08-09 | Discharge: 2014-08-09 | Disposition: A | Discharge status: Home / Self Care | Payer: BLUE CROSS/BLUE SHIELD | Source: Ambulatory Visit | Attending: Women's Health | Admitting: Women's Health

## 2014-08-09 VITALS — BP 115/74 | Ht 66.0 in | Wt 138.6 lb

## 2014-08-09 DIAGNOSIS — Z01419 Encounter for gynecological examination (general) (routine) without abnormal findings: Secondary | ICD-10-CM

## 2014-08-09 DIAGNOSIS — Z01411 Encounter for gynecological examination (general) (routine) with abnormal findings: Secondary | ICD-10-CM | POA: Insufficient documentation

## 2014-08-09 DIAGNOSIS — B977 Papillomavirus as the cause of diseases classified elsewhere: Secondary | ICD-10-CM | POA: Diagnosis not present

## 2014-08-09 DIAGNOSIS — A630 Anogenital (venereal) warts: Secondary | ICD-10-CM

## 2014-08-09 DIAGNOSIS — E039 Hypothyroidism, unspecified: Secondary | ICD-10-CM | POA: Insufficient documentation

## 2014-08-09 DIAGNOSIS — Z1151 Encounter for screening for human papillomavirus (HPV): Secondary | ICD-10-CM | POA: Diagnosis present

## 2014-08-09 MED ORDER — IMIQUIMOD 5 % EX CREA: TOPICAL_CREAM | CUTANEOUS | Status: DC

## 2014-08-09 NOTE — Patient Instructions (Signed)

## 2014-08-09 NOTE — Progress Notes (Signed)
Andrea IvanKristi S Francis 10/21/1965 [id number]    History:    Presents for annual exam.  Regular monthly cycle/not sexually active. Normal Pap history. Refuses mammograms. Anxiety and depression on no medication. Hypothyroid on Synthroid and  bioidentical hormones and supplements per primary care. Gluten-free diet. History of insulin resistance.  Past medical history, past surgical history, family history and social history were all reviewed and documented in the EPIC chart. 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 Mother diabetes and hypertension, father kidney cancer.  ROS:  A ROS was performed and pertinent positives and negatives are included.  Exam:  Filed Vitals:   08/09/14 1055  BP: 115/74    General appearance:  Normal Thyroid:  Symmetrical, normal in size, without palpable masses or nodularity. Respiratory  Auscultation:  Clear without wheezing or rhonchi Cardiovascular  Auscultation:  Regular rate, without rubs, murmurs or gallops  Edema/varicosities:  Not grossly evident Abdominal  Soft,nontender, without masses, guarding or rebound.  Liver/spleen:  No organomegaly noted  Hernia:  None appreciated  Skin  Inspection:  Grossly normal   Breasts: Examined lying and sitting.     Right: Without masses, retractions, discharge or axillary adenopathy.     Left: Without masses, retractions, discharge or axillary adenopathy. Gentitourinary   Inguinal/mons:  Normal without inguinal adenopathy  External genitalia:  2 small condyloma right labia  BUS/Urethra/Skene's glands:  Normal  Vagina:  Normal  Cervix:  Normal  Uterus:   normal in size, shape and contour.  Midline and mobile  Adnexa/parametria:     Rt: Without masses or tenderness.   Lt: Without masses or tenderness.  Anus and perineum: Normal  Digital rectal exam: Normal sphincter tone without palpated masses or tenderness  Assessment/Plan:  49 y.o. DWF G2P0 for annual exam.     Regular monthly cycle/not sexually  active Anxiety depression on no medication Refuses mammogram Hypothyroid primary care manages Perimenopausal -On bioidentical hormones per primary care Condyloma  Plan: Aldara 5% apply small amount 3 times weekly wash off in a.m., prescription, proper use given and reviewed. Instructed to call if no relief. SBE's, strongly encouraged annual screening mammogram, breast center information given and reviewed. Continue healthy lifestyle, regular exercise, calcium rich diet. UA, Pap with HR HPV typing, Pap normal with negative HR HPV typing 2013, new screening guidelines reviewed. Reviewed bioidentical hormones are hormones, risks of blood clots, strokes and breast cancer reviewed. Condoms encouraged if sexually active.    Harrington ChallengerYOUNG,NANCY J [facility] HospitalWHNP, 11:36 AM 08/09/2014

## 2014-08-10 LAB — CYTOLOGY - PAP

## 2014-08-14 ENCOUNTER — Telehealth: Payer: Self-pay | Admitting: *Deleted

## 2014-08-14 NOTE — Telephone Encounter (Signed)
PA done online Imiquimod 5% topical cream, will wait for response.

## 2014-08-15 NOTE — Telephone Encounter (Signed)
Medication was approved for [employer] until 12/11/14

## 2014-09-12 ENCOUNTER — Ambulatory Visit (INDEPENDENT_AMBULATORY_CARE_PROVIDER_SITE_OTHER): Payer: BLUE CROSS/BLUE SHIELD | Admitting: Women's Health

## 2014-09-12 ENCOUNTER — Encounter: Payer: Self-pay | Admitting: Women's Health

## 2014-09-12 VITALS — BP 126/80 | Ht 66.0 in | Wt 143.0 lb

## 2014-09-12 DIAGNOSIS — N9089 Other specified noninflammatory disorders of vulva and perineum: Secondary | ICD-10-CM

## 2014-09-12 NOTE — Progress Notes (Signed)
Patient ID: Andrea Francis, female   DOB: Jan 08, 1966, 49 y.o.   MRN: [id number] Presents for a recheck of questionable 2 small external bump on right perineum. Had used Aldara with no relief, requesting it to be removed. Not sexually active. Monthly cycle. Denies vaginal discharge, urinary symptoms or abdominal pain.  Exam: Appears well. External genitalia, 1 mm skin tag, right perineum, only bump noted.  Small perineal Skin tag  Plan: Reviewed no need for further treatment or removal. Reassurance given. Will watch at this time.

## 2015-05-05 IMAGING — CT CT HEAD W/O CM
1 series · 16 of 30 positions shown, 20 images · non-contrast
Comparison: None.

CLINICAL DATA: Fall.

EXAM:
CT HEAD WITHOUT CONTRAST
TECHNIQUE: Contiguous axial images were obtained from the base of the skull
through the vertex without intravenous contrast.

[Series 2: head 4.8 h37s · axial · 0.46mm/px · z∈[-71,+81]mm · 16 of 36 slices shown, 20 images]
[im 2/36  brain]
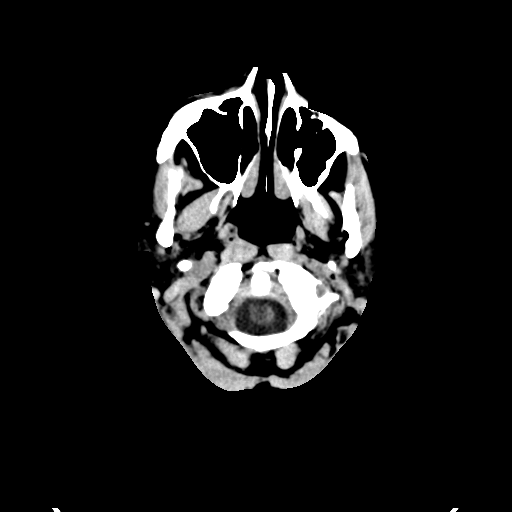
[im 2/36  bone]
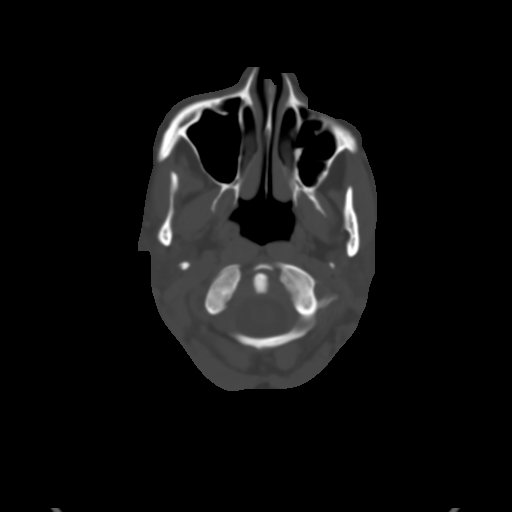
[im 4/36  brain]
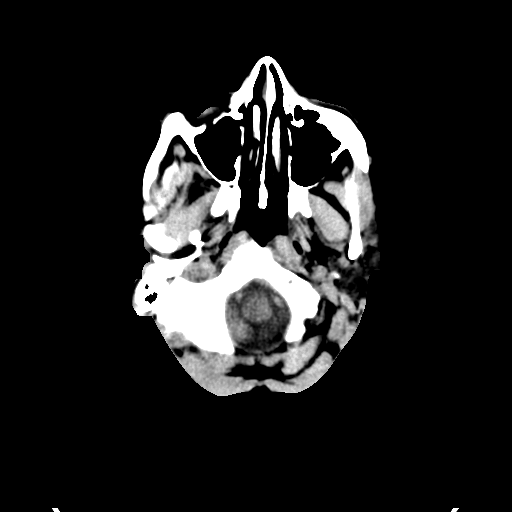
[im 7/36  brain]
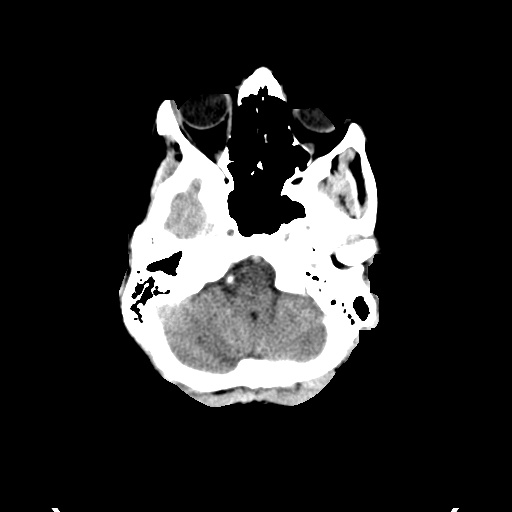
[im 9/36  brain]
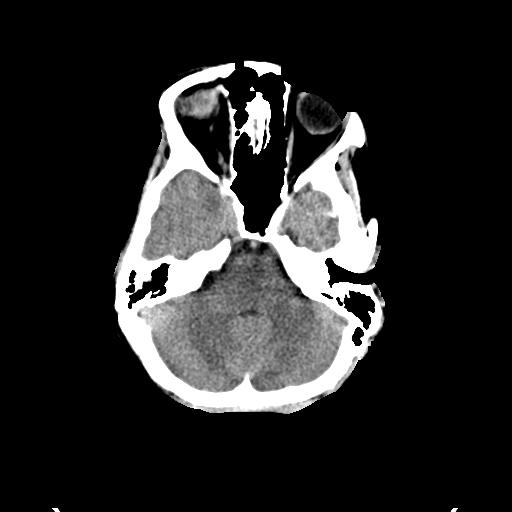
[im 10/36  brain]
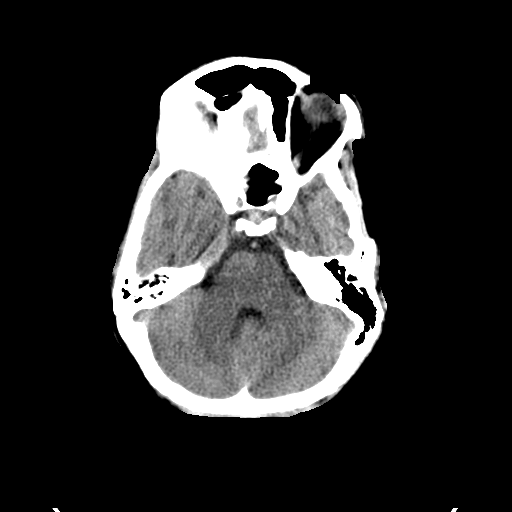
[im 10/36  bone]
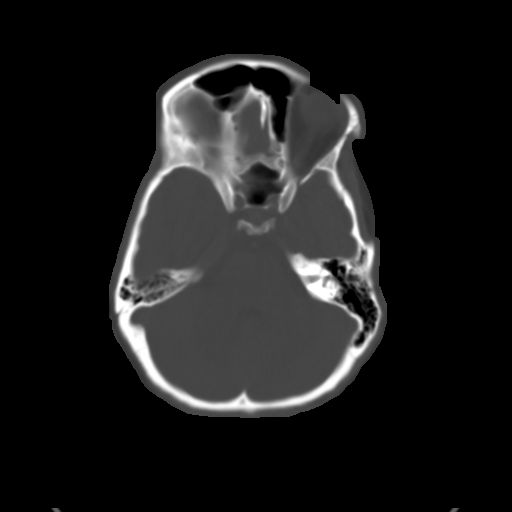
[im 13/36  brain]
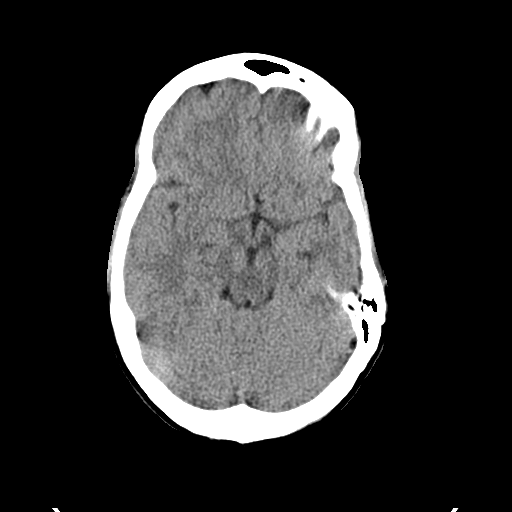
[im 15/36  brain]
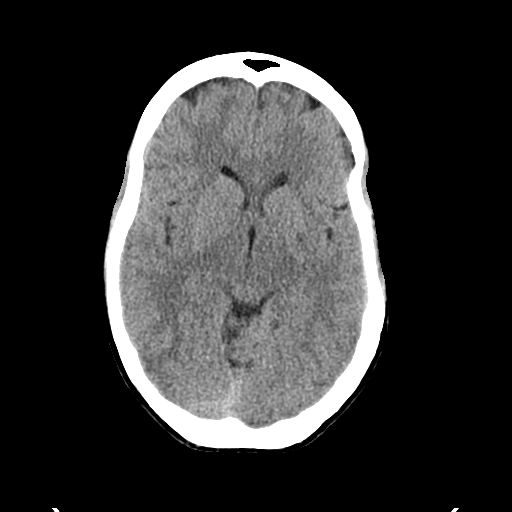
[im 17/36  brain]
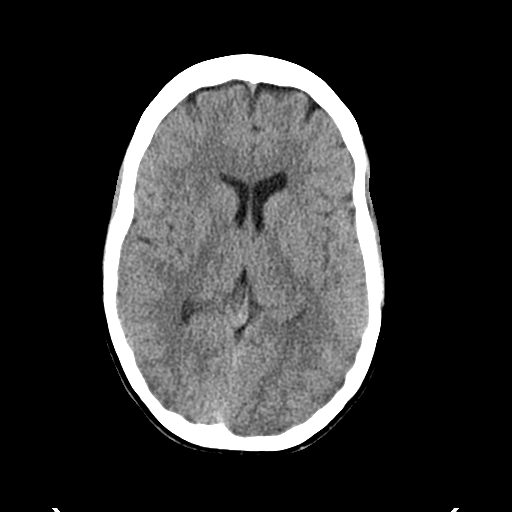
[im 19/36  brain]
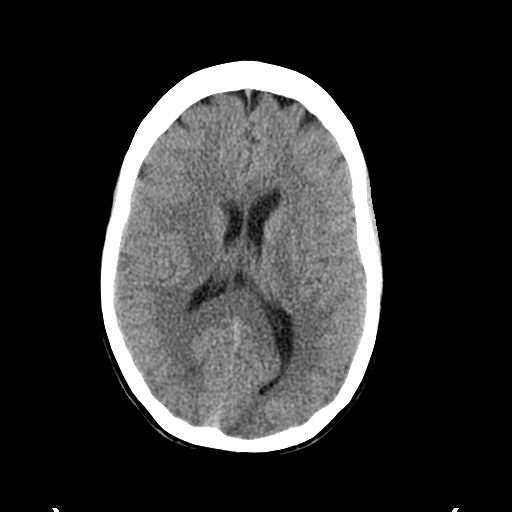
[im 19/36  bone]
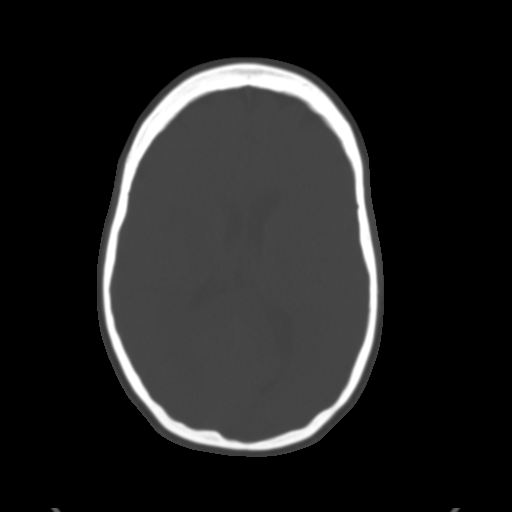
[im 21/36  brain]
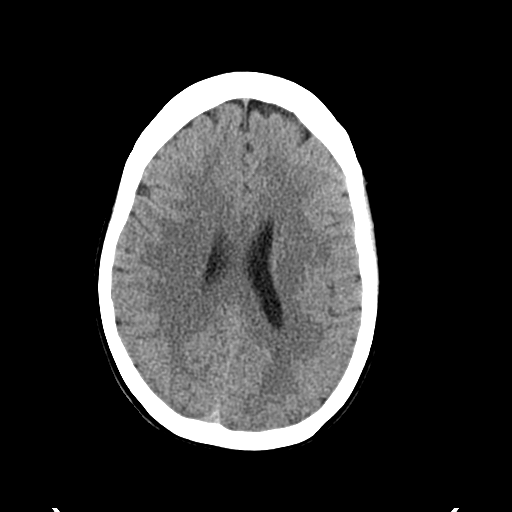
[im 23/36  brain]
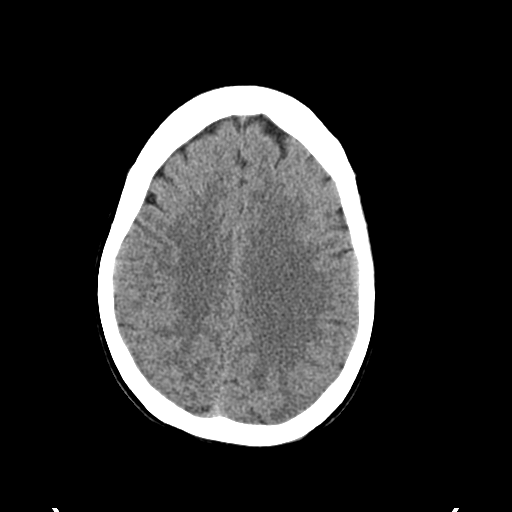
[im 26/36  brain]
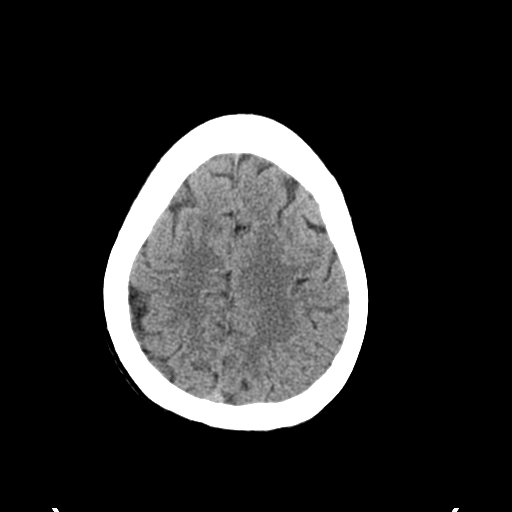
[im 27/36  brain]
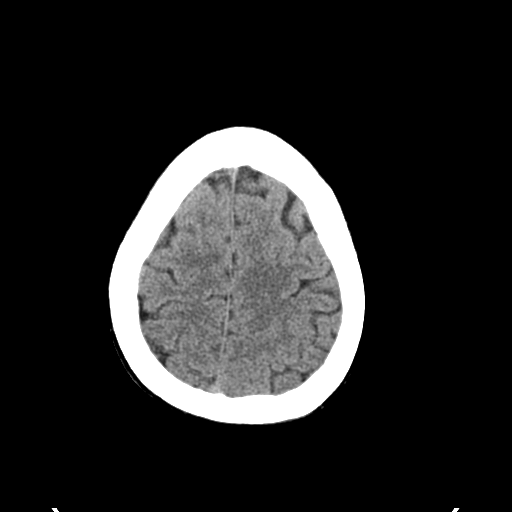
[im 27/36  bone]
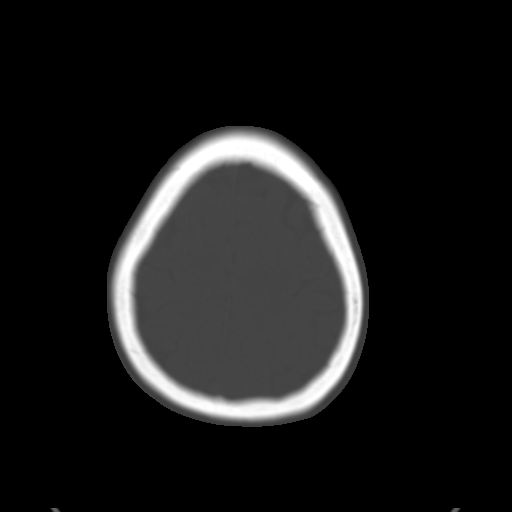
[im 29/36  brain]
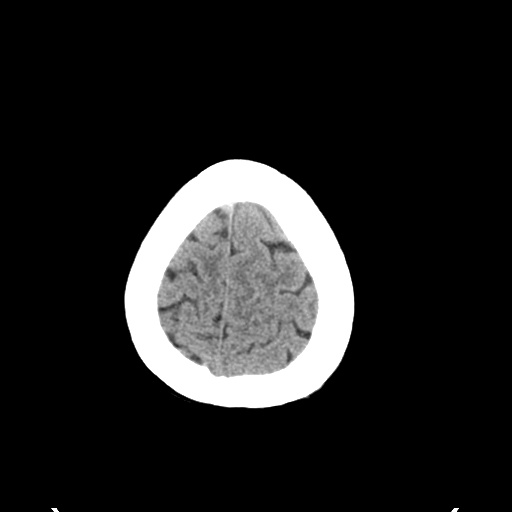
[im 32/36  brain]
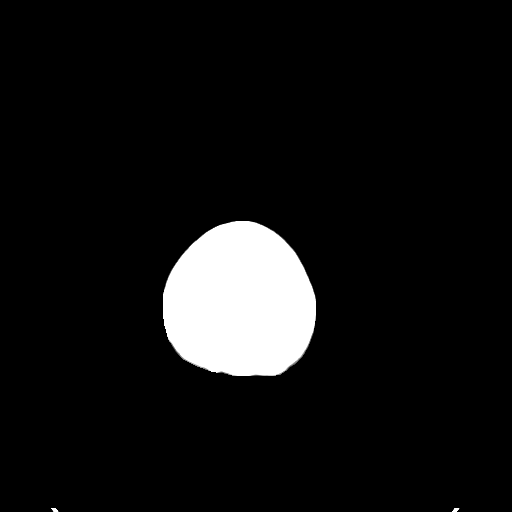
[im 34/36  brain]
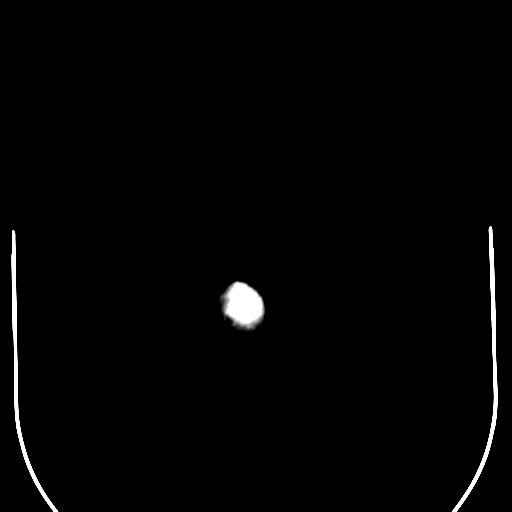

[16 of 30 positions shown; findings below may reference images not displayed]

FINDINGS: Skull and Sinuses:No significant abnormality.

Orbits: No acute abnormality.

Brain: No evidence of acute abnormality, such as acute infarction,
hemorrhage, hydrocephalus, or mass lesion/mass effect.
IMPRESSION: Negative head CT.

## 2016-10-01 ENCOUNTER — Encounter: Payer: Self-pay | Admitting: Gynecology

## 2022-10-05 NOTE — Progress Notes (Signed)
Cardiology Office Note:   Date:  10/08/2022  NAME:  Andrea Francis    MRN: [id number] DOB:  04-09-1966   PCP:  Patient, No Pcp Per  Cardiologist:  None  Electrophysiologist:  None   Referring MD: Eleanora Neighbor, MD   Chief Complaint  Patient presents with   Chest Pain    History of Present Illness:   Andrea Francis is a 57 y.o. female with a hx of HLD who is being seen today for the evaluation of chest pain at the request of Shahid, Al Corpus, MD. she reports for the past year she is dealt with symptoms of heartburn and chest pressure.  She reports she underwent a EGD that showed gastritis.  She also has a Schatzki's ring.  She has been followed by GI.  They recommended esophageal dilation.  She also has a hiatal hernia.  She is still undergoing workup with her GI doctor.  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  She reports no history of heart attack or stroke.  She does describe an episode of palpitations and lightheadedness a year ago.  No family history of heart disease.  She does not smoke.  Her symptoms of chest discomfort include pressure and indigestion.  She also describes tightness.  Symptoms occur randomly.  Does not appear to be exacerbated by activity.  No significant shortness of breath.  She does work from home.  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  She reports she is worried that her heart may be an issue.  We did discuss coronary CTA as well as echocardiogram for further evaluation.  Her EKG does show sinus rhythm with an old anteroseptal infarct but this could be artifact.  She is not married.  She reports no children.  She works from home.  She is a Contractor.  EGD 07/15/2022 -> Gastritis   Past Medical History: Past Medical History:  Diagnosis Date   Anemia    per pt report   Anxiety    Hyperlipidemia    Insulin resistance    Lyme disease    Vitamin D deficiency     Past Surgical History: Past Surgical History:  Procedure  Laterality Date   THERAPEUTIC ABORTION     x2    Current Medications: Current Meds  Medication Sig   metoprolol tartrate (LOPRESSOR) 100 MG tablet Take 1 tablet by mouth once for procedure.   NP THYROID 15 MG tablet Take 15 mg by mouth daily.   NP THYROID 30 MG tablet Take 30 mg by mouth daily.   pantoprazole (PROTONIX) 40 MG tablet Take 40 mg by mouth daily.   [DISCONTINUED] ALPRAZolam (XANAX) 0.25 MG tablet Take 1 tablet (0.25 mg total) by mouth at bedtime as needed.   [DISCONTINUED] fexofenadine-pseudoephedrine (ALLEGRA-D 24) 180-240 MG per 24 hr tablet Take 1 tablet by mouth daily.     [DISCONTINUED] fluticasone (FLONASE) 50 MCG/ACT nasal spray Place 2 sprays into both nostrils daily.   [DISCONTINUED] imiquimod (ALDARA) 5 % cream Apply topically 3 (three) times a week.     Allergies:    Wellbutrin [bupropion hcl] and Terbinafine hcl   Social History: Social History   Socioeconomic History   Marital status: Single    Spouse name: Not on file   Number of children: Not on file   Years of education: Not on file   Highest education level: Not on file  Occupational History   Occupation: Customer Success Specialist  Tobacco Use  Smoking status: Never   Smokeless tobacco: Never  Substance and Sexual Activity   Alcohol use: Yes    Alcohol/week: 5.0 standard drinks of alcohol    Types: 5 Standard drinks or equivalent per week   Drug use: No   Sexual activity: Not Currently    Comment: 56 YEARS OLD, MORE THAN 5 PARTNERS  Other Topics Concern   Not on file  Social History Narrative   Not on file   Social Determinants of Health   Financial Resource Strain: Not on file  Food Insecurity: Not on file  Transportation Needs: Not on file  Physical Activity: Not on file  Stress: Not on file  Social Connections: Not on file     Family History: The patient's family history includes Arthritis in her mother; Cancer in her father; Insulin resistance in her mother; Lupus in her  mother; Seizures in her mother; Thyroid disease in her mother.  ROS:   All other ROS reviewed and negative. Pertinent positives noted in the HPI.     EKGs/Labs/Other Studies Reviewed:   The following studies were personally reviewed by me today:  EKG:  EKG is ordered today.  The ekg ordered today demonstrates normal sinus rhythm heart rate 82, anteroseptal infarct, short PR interval, and was personally reviewed by me.   Recent Labs: No results found for requested labs within last 365 days.   Recent Lipid Panel No results found for: "CHOL", "TRIG", "HDL", "CHOLHDL", "VLDL", "LDLCALC", "LDLDIRECT"  Physical Exam:   VS:  BP 120/82   Pulse 82   Ht 5' 5.75" (1.67 m)   Wt 144 lb 3.2 oz (65.4 kg)   SpO2 96%   BMI 23.45 kg/m    Wt Readings from Last 3 Encounters:  10/08/22 144 lb 3.2 oz (65.4 kg)  09/12/14 143 lb (64.9 kg)  08/09/14 138 lb 9.6 oz (62.9 kg)    General: Well nourished, well developed, in no acute distress Head: Atraumatic, normal size  Eyes: PEERLA, EOMI  Neck: Supple, no JVD Endocrine: No thryomegaly Cardiac: Normal S1, S2; RRR; no murmurs, rubs, or gallops Lungs: Clear to auscultation bilaterally, no wheezing, rhonchi or rales  Abd: Soft, nontender, no hepatomegaly  Ext: No edema, pulses 2+ Musculoskeletal: No deformities, BUE and BLE strength normal and equal Skin: Warm and dry, no rashes   Neuro: Alert and oriented to person, place, time, and situation, CNII-XII grossly intact, no focal deficits  Psych: Normal mood and affect   ASSESSMENT:   Andrea Francis is a 57 y.o. female who presents for the following: 1. Precordial pain   2. Gastroesophageal reflux disease without esophagitis   3. Nonspecific abnormal electrocardiogram (ECG) (EKG)     PLAN:   1. Precordial pain 2. Gastroesophageal reflux disease without esophagitis 3. Nonspecific abnormal electrocardiogram (ECG) (EKG) -Symptoms of chest discomfort and pressure.  Symptoms have been attributed  to indigestion.  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  Suspect this is artifact.  She reports no prior history of heart disease.  To further evaluate her chest symptoms we will go ahead with coronary CTA.  We will need a BMP today.  This will exclude obstructive CAD.  I would also like for her to get an echocardiogram.  Overall, I do suspect her symptoms are just acid reflux related.  She will work with her GI doctor on this.  She may need evaluation for hiatal hernia surgery.  She will see me back as needed based on the results of the  above test.  Disposition: No follow-ups on file.  Medication Adjustments/Labs and Tests Ordered: Current medicines are reviewed at length with the patient today.  Concerns regarding medicines are outlined above.  Orders Placed This Encounter  Procedures   CT CORONARY MORPH W/CTA COR W/SCORE W/CA W/CM &/OR WO/CM   Basic metabolic panel   EKG 12-Lead   ECHOCARDIOGRAM COMPLETE   Meds ordered this encounter  Medications   metoprolol tartrate (LOPRESSOR) 100 MG tablet    Sig: Take 1 tablet by mouth once for procedure.    Dispense:  1 tablet    Refill:  0    Patient Instructions  Medication Instructions:  Take Metoprolol 100 mg two hours before CT when scheduled.   *If you need a refill on your cardiac medications before your next appointment, please call your pharmacy*   Lab Work: BMET today   If you have labs (blood work) drawn today and your tests are completely normal, you will receive your results only by: MyChart Message (if you have MyChart) OR A paper copy in the mail If you have any lab test that is abnormal or we need to change your treatment, we will call you to review the results.   Testing/Procedures: Echocardiogram - Your physician has requested that you have an echocardiogram. Echocardiography is a painless test that uses sound waves to create images of your heart. It provides your doctor with information about the size and shape of  your heart and how well your heart's chambers and valves are working. This procedure takes approximately one hour. There are no restrictions for this procedure.   Coronary CTA- they will call you to get this scheduled.    Follow-Up: At [facility], you and your health needs are our priority.  As part of our continuing mission to provide you with exceptional heart care, we have created designated Provider Care Teams.  These Care Teams include your primary Cardiologist (physician) and Advanced Practice Providers (APPs -  Physician Assistants and Nurse Practitioners) who all work together to provide you with the care you need, when you need it.  We recommend signing up for the patient portal called "MyChart".  Sign up information is provided on this After Visit Summary.  MyChart is used to connect with patients for Virtual Visits (Telemedicine).  Patients are able to view lab/test results, encounter notes, upcoming appointments, etc.  Non-urgent messages can be sent to your provider as well.   To learn more about what you can do with MyChart, go to [email or web address].    Your next appointment:   As needed  Provider:   Lennie Odor, MD   Other Instructions   Your cardiac CT will be scheduled at one of the below locations:   [facility] [address] [place], [place] [address] [phone]   If scheduled at [facility], please arrive at the [facility] and Children's Entrance (Entrance C2) of [facility] 30 minutes prior to test start time. You can use the FREE valet parking offered at entrance C (encouraged to control the heart rate for the test)  Proceed to the [facility] Radiology Department (first floor) to check-in and test prep.  All radiology patients and guests should use entrance C2 at [facility], accessed from [facility], even though the hospital's physical address listed is [address].    Please  follow these instructions carefully (unless otherwise directed):   On the Night Before the Test: Be sure  to Drink plenty of water. Do not consume any caffeinated/decaffeinated beverages or chocolate 12 hours prior to your test. Do not take any antihistamines 12 hours prior to your test.  On the Day of the Test: Drink plenty of water until 1 hour prior to the test. Do not eat any food 1 hour prior to test. You may take your regular medications prior to the test.  Take metoprolol (Lopressor) two hours prior to test. If you take Furosemide/Hydrochlorothiazide/Spironolactone, please HOLD on the morning of the test. FEMALES- please wear underwire-free bra if available, avoid dresses & tight clothing      After the Test: Drink plenty of water. After receiving IV contrast, you may experience a mild flushed feeling. This is normal. On occasion, you may experience a mild rash up to 24 hours after the test. This is not dangerous. If this occurs, you can take Benadryl 25 mg and increase your fluid intake. If you experience trouble breathing, this can be serious. If it is severe call 911 IMMEDIATELY. If it is mild, please call our office. If you take any of these medications: Glipizide/Metformin, Avandament, Glucavance, please do not take 48 hours after completing test unless otherwise instructed.  We will call to schedule your test 2-4 weeks out understanding that some insurance companies will need an authorization prior to the service being performed.   For non-scheduling related questions, please contact the cardiac imaging nurse navigator should you have any questions/concerns: Rockwell Alexandria, Cardiac Imaging Nurse Navigator Larey Brick, Cardiac Imaging Nurse Navigator [facility] Direct Office Dial: [phone]   For scheduling needs, including cancellations and rescheduling, please call [place], [phone].     Signed, Lenna Gilford. Flora Lipps, MD, [facility]  [facility]  [address], [address] [place], [place] [address] [phone]  10/08/2022 3:06 PM

## 2022-10-08 ENCOUNTER — Ambulatory Visit: Payer: 59 | Attending: Internal Medicine | Admitting: Cardiovascular Disease

## 2022-10-08 ENCOUNTER — Encounter: Payer: Self-pay | Admitting: Cardiovascular Disease

## 2022-10-08 VITALS — BP 120/82 | HR 82 | Ht 65.75 in | Wt 144.2 lb

## 2022-10-08 DIAGNOSIS — R072 Precordial pain: Secondary | ICD-10-CM | POA: Diagnosis not present

## 2022-10-08 DIAGNOSIS — K219 Gastro-esophageal reflux disease without esophagitis: Secondary | ICD-10-CM | POA: Diagnosis not present

## 2022-10-08 DIAGNOSIS — R9431 Abnormal electrocardiogram [ECG] [EKG]: Secondary | ICD-10-CM

## 2022-10-08 MED ORDER — METOPROLOL TARTRATE 100 MG PO TABS: ORAL_TABLET | ORAL | 0 refills | Status: AC

## 2022-10-08 NOTE — Patient Instructions (Signed)
Medication Instructions:  Take Metoprolol 100 mg two hours before CT when scheduled.   *If you need a refill on your cardiac medications before your next appointment, please call your pharmacy*   Lab Work: BMET today   If you have labs (blood work) drawn today and your tests are completely normal, you will receive your results only by: MyChart Message (if you have MyChart) OR A paper copy in the mail If you have any lab test that is abnormal or we need to change your treatment, we will call you to review the results.   Testing/Procedures: Echocardiogram - Your physician has requested that you have an echocardiogram. Echocardiography is a painless test that uses sound waves to create images of your heart. It provides your doctor with information about the size and shape of your heart and how well your heart's chambers and valves are working. This procedure takes approximately one hour. There are no restrictions for this procedure.   Coronary CTA- they will call you to get this scheduled.    Follow-Up: At [facility], you and your health needs are our priority.  As part of our continuing mission to provide you with exceptional heart care, we have created designated Provider Care Teams.  These Care Teams include your primary Cardiologist (physician) and Advanced Practice Providers (APPs -  Physician Assistants and Nurse Practitioners) who all work together to provide you with the care you need, when you need it.  We recommend signing up for the patient portal called "MyChart".  Sign up information is provided on this After Visit Summary.  MyChart is used to connect with patients for Virtual Visits (Telemedicine).  Patients are able to view lab/test results, encounter notes, upcoming appointments, etc.  Non-urgent messages can be sent to your provider as well.   To learn more about what you can do with MyChart, go to [email or web address].    Your next appointment:   As  needed  Provider:   Lennie Odor, MD   Other Instructions   Your cardiac CT will be scheduled at one of the below locations:   [facility] [address] [place], [place] [address] [phone]   If scheduled at [facility], please arrive at the [facility] and Children's Entrance (Entrance C2) of [facility] 30 minutes prior to test start time. You can use the FREE valet parking offered at entrance C (encouraged to control the heart rate for the test)  Proceed to the [facility] Radiology Department (first floor) to check-in and test prep.  All radiology patients and guests should use entrance C2 at [facility], accessed from [facility], even though the hospital's physical address listed is [address].    Please follow these instructions carefully (unless otherwise directed):   On the Night Before the Test: Be sure to Drink plenty of water. Do not consume any caffeinated/decaffeinated beverages or chocolate 12 hours prior to your test. Do not take any antihistamines 12 hours prior to your test.  On the Day of the Test: Drink plenty of water until 1 hour prior to the test. Do not eat any food 1 hour prior to test. You may take your regular medications prior to the test.  Take metoprolol (Lopressor) two hours prior to test. If you take Furosemide/Hydrochlorothiazide/Spironolactone, please HOLD on the morning of the test. FEMALES- please wear underwire-free bra if available, avoid dresses & tight clothing      After the Test: Drink plenty  of water. After receiving IV contrast, you may experience a mild flushed feeling. This is normal. On occasion, you may experience a mild rash up to 24 hours after the test. This is not dangerous. If this occurs, you can take Benadryl 25 mg and increase your fluid intake. If you experience trouble breathing, this can be serious. If it is severe call 911 IMMEDIATELY. If it is mild,  please call our office. If you take any of these medications: Glipizide/Metformin, Avandament, Glucavance, please do not take 48 hours after completing test unless otherwise instructed.  We will call to schedule your test 2-4 weeks out understanding that some insurance companies will need an authorization prior to the service being performed.   For non-scheduling related questions, please contact the cardiac imaging nurse navigator should you have any questions/concerns: Rockwell Alexandria, Cardiac Imaging Nurse Navigator Larey Brick, Cardiac Imaging Nurse Navigator [facility] Direct Office Dial: [phone]   For scheduling needs, including cancellations and rescheduling, please call [place], [phone].

## 2022-10-09 LAB — BASIC METABOLIC PANEL
BUN/Creatinine Ratio: 19 (ref 9–23)
BUN: 19 mg/dL (ref 6–24)
CO2: 21 mmol/L (ref 20–29)
Calcium: 9.8 mg/dL (ref 8.7–10.2)
Chloride: 103 mmol/L (ref 96–106)
Creatinine, Ser: 0.98 mg/dL (ref 0.57–1.00)
Glucose: 112 mg/dL — ABNORMAL HIGH (ref 70–99)
Potassium: 4.2 mmol/L (ref 3.5–5.2)
Sodium: 142 mmol/L (ref 134–144)
eGFR: 68 mL/min/{1.73_m2} (ref >59)

## 2022-10-17 ENCOUNTER — Telehealth (HOSPITAL_COMMUNITY): Payer: Self-pay | Admitting: Emergency Medicine

## 2022-10-17 NOTE — Telephone Encounter (Signed)
Reaching out to patient to offer assistance regarding upcoming cardiac imaging study; pt verbalizes understanding of appt date/time, parking situation and where to check in, pre-test NPO status and medications ordered, and verified current allergies; name and call back number provided for further questions should they arise Nyjae Hodge RN Navigator Cardiac Imaging [facility] [phone] office [phone] cell 

## 2022-10-20 ENCOUNTER — Ambulatory Visit (HOSPITAL_COMMUNITY)
Admission: RE | Admit: 2022-10-20 | Discharge: 2022-10-20 | Disposition: A | Discharge status: Home / Self Care | Payer: 59 | Source: Ambulatory Visit | Attending: Cardiovascular Disease | Admitting: Cardiovascular Disease

## 2022-10-20 ENCOUNTER — Encounter (HOSPITAL_COMMUNITY): Payer: Self-pay

## 2022-10-20 DIAGNOSIS — R072 Precordial pain: Secondary | ICD-10-CM | POA: Insufficient documentation

## 2022-10-20 MED ORDER — IOHEXOL 350 MG/ML SOLN
100.0000 mL | Freq: Once | INTRAVENOUS | Status: AC | PRN
  Administered 2022-10-20: 100 mL via INTRAVENOUS

## 2022-10-20 MED ORDER — NITROGLYCERIN 0.4 MG SL SUBL
0.8000 mg | SUBLINGUAL_TABLET | Freq: Once | SUBLINGUAL | Status: AC
  Administered 2022-10-20: 0.8 mg via SUBLINGUAL

## 2022-10-20 MED ORDER — NITROGLYCERIN 0.4 MG SL SUBL
SUBLINGUAL_TABLET | SUBLINGUAL | Status: AC
  Filled 2022-10-20: qty 2

## 2022-10-20 NOTE — Progress Notes (Signed)
Patient tolerated CT well.Vital signs stable encourage to drink water throughout day.Reasons explained and verbalized understanding. Ambulated steady gait.   

## 2022-10-27 ENCOUNTER — Emergency Department (HOSPITAL_BASED_OUTPATIENT_CLINIC_OR_DEPARTMENT_OTHER): Payer: 59

## 2022-10-27 ENCOUNTER — Other Ambulatory Visit: Payer: Self-pay

## 2022-10-27 ENCOUNTER — Other Ambulatory Visit (HOSPITAL_BASED_OUTPATIENT_CLINIC_OR_DEPARTMENT_OTHER): Payer: Self-pay

## 2022-10-27 ENCOUNTER — Ambulatory Visit (HOSPITAL_BASED_OUTPATIENT_CLINIC_OR_DEPARTMENT_OTHER): Admission: RE | Admit: 2022-10-27 | Payer: BLUE CROSS/BLUE SHIELD | Source: Ambulatory Visit

## 2022-10-27 ENCOUNTER — Emergency Department (HOSPITAL_BASED_OUTPATIENT_CLINIC_OR_DEPARTMENT_OTHER)
Admission: EM | Admit: 2022-10-27 | Discharge: 2022-10-27 | Disposition: A | Discharge status: Home / Self Care | Payer: 59 | Attending: Emergency Medicine | Admitting: Emergency Medicine

## 2022-10-27 DIAGNOSIS — W540XXA Bitten by dog, initial encounter: Secondary | ICD-10-CM | POA: Diagnosis not present

## 2022-10-27 DIAGNOSIS — S51812A Laceration without foreign body of left forearm, initial encounter: Secondary | ICD-10-CM | POA: Diagnosis not present

## 2022-10-27 DIAGNOSIS — Z23 Encounter for immunization: Secondary | ICD-10-CM | POA: Insufficient documentation

## 2022-10-27 DIAGNOSIS — S61432A Puncture wound without foreign body of left hand, initial encounter: Secondary | ICD-10-CM | POA: Diagnosis not present

## 2022-10-27 DIAGNOSIS — S80812A Abrasion, left lower leg, initial encounter: Secondary | ICD-10-CM | POA: Insufficient documentation

## 2022-10-27 DIAGNOSIS — T07XXXA Unspecified multiple injuries, initial encounter: Secondary | ICD-10-CM

## 2022-10-27 DIAGNOSIS — S80811A Abrasion, right lower leg, initial encounter: Secondary | ICD-10-CM | POA: Insufficient documentation

## 2022-10-27 DIAGNOSIS — S6992XA Unspecified injury of left wrist, hand and finger(s), initial encounter: Secondary | ICD-10-CM | POA: Diagnosis present

## 2022-10-27 DIAGNOSIS — S62631B Displaced fracture of distal phalanx of left index finger, initial encounter for open fracture: Secondary | ICD-10-CM | POA: Diagnosis not present

## 2022-10-27 MED ORDER — SODIUM CHLORIDE 0.9 % IV SOLN: INTRAVENOUS | Status: DC | PRN

## 2022-10-27 MED ORDER — LIDOCAINE-EPINEPHRINE-TETRACAINE (LET) TOPICAL GEL
3.0000 mL | Freq: Once | TOPICAL | Status: AC
  Administered 2022-10-27: 3 mL via TOPICAL
  Filled 2022-10-27: qty 3

## 2022-10-27 MED ORDER — TETANUS-DIPHTH-ACELL PERTUSSIS 5-2.5-18.5 LF-MCG/0.5 IM SUSY
0.5000 mL | PREFILLED_SYRINGE | Freq: Once | INTRAMUSCULAR | Status: AC
  Administered 2022-10-27: 0.5 mL via INTRAMUSCULAR
  Filled 2022-10-27: qty 0.5

## 2022-10-27 MED ORDER — LIDOCAINE HCL 2 % IJ SOLN
5.0000 mL | Freq: Once | INTRAMUSCULAR | Status: AC
  Administered 2022-10-27: 100 mg
  Filled 2022-10-27: qty 20

## 2022-10-27 MED ORDER — ACETAMINOPHEN 500 MG PO TABS
1000.0000 mg | ORAL_TABLET | Freq: Once | ORAL | Status: AC
  Administered 2022-10-27: 1000 mg via ORAL
  Filled 2022-10-27: qty 2

## 2022-10-27 MED ORDER — AMOXICILLIN-POT CLAVULANATE 875-125 MG PO TABS
1.0000 | ORAL_TABLET | Freq: Two times a day (BID) | ORAL | 0 refills | Status: AC
  Filled 2022-10-27: qty 14, 7d supply, fill #0

## 2022-10-27 MED ORDER — SODIUM CHLORIDE 0.9 % IV SOLN
3.0000 g | Freq: Once | INTRAVENOUS | Status: AC
  Administered 2022-10-27: 3 g via INTRAVENOUS

## 2022-10-27 MED ORDER — KETOROLAC TROMETHAMINE 60 MG/2ML IM SOLN
60.0000 mg | Freq: Once | INTRAMUSCULAR | Status: AC
  Administered 2022-10-27: 60 mg via INTRAMUSCULAR
  Filled 2022-10-27: qty 2

## 2022-10-27 NOTE — ED Triage Notes (Signed)
Patient presents to ED via POV from home. Here after attempting to break up a dog fight. Patient has been trying to bring in a stray that got into a fight with her other dog this morning. Patient reports both dogs are up to date on their rabies vaccine. Ambulatory on arrival. Laceration noted to left lateral lower leg. Puncture wounds noted to right upper and lower arm. No active bleeding noted.

## 2022-10-27 NOTE — ED Notes (Signed)
ED Provider at bedside. 

## 2022-10-27 NOTE — ED Provider Notes (Signed)
  [facility] Provider Note   CSN: [id number] Arrival date & time: 10/27/22  1478     History {Add pertinent medical, surgical, social history, OB history to HPI:1} Chief Complaint  Patient presents with   Animal Bite    Andrea Francis is a 57 y.o. female.  HPI       Past Medical History:  Diagnosis Date   Anemia    per pt report   Anxiety    Hyperlipidemia    Insulin resistance    Lyme disease    Vitamin D deficiency      Home Medications Prior to Admission medications   Medication Sig Start Date End Date Taking? Authorizing Provider  metoprolol tartrate (LOPRESSOR) 100 MG tablet Take 1 tablet by mouth once for procedure. 10/08/22   Sande Rives, MD  NP THYROID 15 MG tablet Take 15 mg by mouth daily. 09/30/22   [provider]  NP THYROID 30 MG tablet Take 30 mg by mouth daily. 09/30/22   [provider]  pantoprazole (PROTONIX) 40 MG tablet Take 40 mg by mouth daily. 09/10/22   [provider]  testosterone cypionate (DEPOTESTOSTERONE CYPIONATE) 200 MG/ML injection Inject 100 mg into the muscle. INMPLANT IN HIP    [provider]      Allergies    Wellbutrin [bupropion hcl] and Terbinafine hcl    Review of Systems   Review of Systems  Physical Exam Updated Vital Signs BP 118/66   Pulse 72   Temp 98 F (36.7 C) (Oral)   Resp 17   Ht 5\' 6"  (1.676 m)   Wt 65.3 kg   SpO2 96%   BMI 23.24 kg/m  Physical Exam  ED Results / Procedures / Treatments   Labs (all labs ordered are listed, but only abnormal results are displayed) Labs Reviewed - No data to display  EKG None  Radiology No results found.  Procedures Procedures  {Document cardiac monitor, telemetry assessment procedure when appropriate:1}  Medications Ordered in ED Medications  lidocaine (XYLOCAINE) 2 % (with pres) injection 100 mg (has no administration in time range)  Tdap (BOOSTRIX) injection  0.5 mL (has no administration in time range)  ketorolac (TORADOL) injection 60 mg (has no administration in time range)  acetaminophen (TYLENOL) tablet 1,000 mg (has no administration in time range)  lidocaine-EPINEPHrine-tetracaine (LET) topical gel (has no administration in time range)       ED Course/ Medical Decision Making/ A&P   {   Click here for ABCD2, HEART and other calculatorsREFRESH Note before signing :1}                           ***  {Document critical care time when appropriate:1} {Document review of labs and clinical decision tools ie heart score, Chads2Vasc2 etc:1}  {Document your independent review of radiology images, and any outside records:1} {Document your discussion with family members, caretakers, and with consultants:1} {Document social determinants of health affecting pt's care:1} {Document your decision making why or why not admission, treatments were needed:1} Final Clinical Impression(s) / ED Diagnoses Final diagnoses:  None    Rx / DC Orders ED Discharge Orders     None

## 2022-11-13 ENCOUNTER — Ambulatory Visit (HOSPITAL_COMMUNITY): Payer: BLUE CROSS/BLUE SHIELD

## 2022-12-04 ENCOUNTER — Ambulatory Visit: Payer: BLUE CROSS/BLUE SHIELD | Admitting: Internal Medicine

## 2024-02-05 ENCOUNTER — Emergency Department (HOSPITAL_BASED_OUTPATIENT_CLINIC_OR_DEPARTMENT_OTHER)
Admission: EM | Admit: 2024-02-05 | Discharge: 2024-02-05 | Disposition: A | Discharge status: Home / Self Care | Attending: Emergency Medicine | Admitting: Emergency Medicine

## 2024-02-05 ENCOUNTER — Encounter (HOSPITAL_BASED_OUTPATIENT_CLINIC_OR_DEPARTMENT_OTHER): Payer: Self-pay

## 2024-02-05 ENCOUNTER — Other Ambulatory Visit: Payer: Self-pay

## 2024-02-05 DIAGNOSIS — R1032 Left lower quadrant pain: Secondary | ICD-10-CM

## 2024-02-05 DIAGNOSIS — N390 Urinary tract infection, site not specified: Secondary | ICD-10-CM | POA: Diagnosis not present

## 2024-02-05 LAB — URINALYSIS, MICROSCOPIC (REFLEX)

## 2024-02-05 LAB — CBC WITH DIFFERENTIAL/PLATELET
Abs Immature Granulocytes: 0.01 K/uL (ref 0.00–0.07)
Basophils Absolute: 0 K/uL (ref 0.0–0.1)
Basophils Relative: 1 %
Eosinophils Absolute: 0.1 K/uL (ref 0.0–0.5)
Eosinophils Relative: 1 %
HCT: 39.7 % (ref 36.0–46.0)
Hemoglobin: 13 g/dL (ref 12.0–15.0)
Immature Granulocytes: 0 %
Lymphocytes Relative: 15 %
Lymphs Abs: 0.9 K/uL (ref 0.7–4.0)
MCH: 29.1 pg (ref 26.0–34.0)
MCHC: 32.7 g/dL (ref 30.0–36.0)
MCV: 89 fL (ref 80.0–100.0)
Monocytes Absolute: 0.6 K/uL (ref 0.1–1.0)
Monocytes Relative: 10 %
Neutro Abs: 4.2 K/uL (ref 1.7–7.7)
Neutrophils Relative %: 73 %
Platelets: 220 K/uL (ref 150–400)
RBC: 4.46 MIL/uL (ref 3.87–5.11)
RDW: 13.9 % (ref 11.5–15.5)
WBC: 5.8 K/uL (ref 4.0–10.5)
nRBC: 0 % (ref 0.0–0.2)

## 2024-02-05 LAB — COMPREHENSIVE METABOLIC PANEL WITH GFR
ALT: 27 U/L (ref 0–44)
AST: 25 U/L (ref 15–41)
Albumin: 4.5 g/dL (ref 3.5–5.0)
Alkaline Phosphatase: 85 U/L (ref 38–126)
Anion gap: 11 (ref 5–15)
BUN: 13 mg/dL (ref 6–20)
CO2: 24 mmol/L (ref 22–32)
Calcium: 9.3 mg/dL (ref 8.9–10.3)
Chloride: 105 mmol/L (ref 98–111)
Creatinine, Ser: 0.94 mg/dL (ref 0.44–1.00)
GFR, Estimated: >60 mL/min (ref >60)
Glucose, Bld: 95 mg/dL (ref 70–99)
Potassium: 3.9 mmol/L (ref 3.5–5.1)
Sodium: 140 mmol/L (ref 135–145)
Total Bilirubin: 0.5 mg/dL (ref 0.0–1.2)
Total Protein: 7.1 g/dL (ref 6.5–8.1)

## 2024-02-05 LAB — URINALYSIS, ROUTINE W REFLEX MICROSCOPIC
Bilirubin Urine: NEGATIVE
Glucose, UA: NEGATIVE mg/dL
Hgb urine dipstick: NEGATIVE
Ketones, ur: NEGATIVE mg/dL
Nitrite: NEGATIVE
Protein, ur: NEGATIVE mg/dL
Specific Gravity, Urine: 1.01 (ref 1.005–1.030)
pH: 7 (ref 5.0–8.0)

## 2024-02-05 LAB — LIPASE, BLOOD: Lipase: 25 U/L (ref 11–51)

## 2024-02-05 MED ORDER — SODIUM CHLORIDE 0.9 % IV BOLUS
1000.0000 mL | Freq: Once | INTRAVENOUS | Status: AC
  Administered 2024-02-05: 1000 mL via INTRAVENOUS

## 2024-02-05 MED ORDER — KETOROLAC TROMETHAMINE 15 MG/ML IJ SOLN
15.0000 mg | Freq: Once | INTRAMUSCULAR | Status: AC
  Administered 2024-02-05: 15 mg via INTRAVENOUS
  Filled 2024-02-05: qty 1

## 2024-02-05 MED ORDER — ONDANSETRON HCL 4 MG/2ML IJ SOLN
4.0000 mg | Freq: Once | INTRAMUSCULAR | Status: AC
  Administered 2024-02-05: 4 mg via INTRAVENOUS
  Filled 2024-02-05: qty 2

## 2024-02-05 MED ORDER — METRONIDAZOLE 500 MG PO TABS: 500.0000 mg | ORAL_TABLET | Freq: Two times a day (BID) | ORAL | 0 refills | Status: AC

## 2024-02-05 MED ORDER — CIPROFLOXACIN HCL 500 MG PO TABS: 500.0000 mg | ORAL_TABLET | Freq: Two times a day (BID) | ORAL | 0 refills | Status: AC

## 2024-02-05 NOTE — Discharge Instructions (Signed)
 Take the antibiotics as prescribed to treat urinary tract infection and also possible diverticulitis.  Follow-up with your doctor next week to be rechecked.  Return to the ER if you start having fevers, increasing pain vomiting or other concerning symptoms

## 2024-02-05 NOTE — ED Triage Notes (Signed)
 Pt reports LLQ abdominal pain X 2 days. Denies N,V. Pt also endorses constipation.

## 2024-02-05 NOTE — ED Notes (Signed)
 Pt alert and oriented X 4 at the time of discharge. RR even and unlabored. No acute distress noted. Pt verbalized understanding of discharge instructions as discussed. Pt ambulatory to lobby at time of discharge.

## 2024-02-05 NOTE — ED Provider Notes (Signed)
 [place] [facility] Provider Note   CSN: [id number] Arrival date & time: 02/05/24  1012     Patient presents with: Abdominal Pain   Andrea Francis is a 58 y.o. female.    Abdominal Pain    Patient has a history of anemia hyperlipidemia pulm disease.  Patient presents ED for evaluation of abdominal pain.  Patient states prior to this she had been having some issues with her back.  She was started on some muscle relaxants and noted improvement.  Patient also states that the weekend prior she had been out in the heat.  She was drinking a lot of fluids but noted that she had not urinated the whole day while she was outside.  Patient started having abdominal pain about 2 days ago.  Its mostly in her left lower quadrant.  It does not radiate.  She has not had any nausea vomiting diarrhea.  No dysuria.  No prior abdominal surgeries.  Prior to Admission medications   Medication Sig Start Date End Date Taking? Authorizing Provider  ciprofloxacin  (CIPRO ) 500 MG tablet Take 1 tablet (500 mg total) by mouth 2 (two) times daily. 02/05/24  Yes Randol Simmonds, MD  metroNIDAZOLE  (FLAGYL ) 500 MG tablet Take 1 tablet (500 mg total) by mouth 2 (two) times daily. 02/05/24  Yes Randol Simmonds, MD  metoprolol  tartrate (LOPRESSOR ) 100 MG tablet Take 1 tablet by mouth once for procedure. 10/08/22   Barbaraann Darryle Ned, MD  NP THYROID  15 MG tablet Take 15 mg by mouth daily. 09/30/22   [provider]  NP THYROID  30 MG tablet Take 30 mg by mouth daily. 09/30/22   [provider]  pantoprazole (PROTONIX) 40 MG tablet Take 40 mg by mouth daily. 09/10/22   [provider]  testosterone cypionate (DEPOTESTOSTERONE CYPIONATE) 200 MG/ML injection Inject 100 mg into the muscle. INMPLANT IN HIP    [provider]    Allergies: Wellbutrin [bupropion hcl] and Terbinafine hcl    Review of Systems  Gastrointestinal:  Positive for abdominal pain.     Updated Vital Signs BP (!) 142/87 (BP Location: Right Arm)   Pulse 73   Temp 97.9 F (36.6 C) (Oral)   Resp 16   SpO2 100%   Physical Exam Vitals and nursing note reviewed.  Constitutional:      General: She is not in acute distress.    Appearance: She is well-developed.  HENT:     Head: Normocephalic and atraumatic.     Right Ear: External ear normal.     Left Ear: External ear normal.  Eyes:     General: No scleral icterus.       Right eye: No discharge.        Left eye: No discharge.     Conjunctiva/sclera: Conjunctivae normal.  Neck:     Trachea: No tracheal deviation.  Cardiovascular:     Rate and Rhythm: Normal rate and regular rhythm.  Pulmonary:     Effort: Pulmonary effort is normal. No respiratory distress.     Breath sounds: Normal breath sounds. No stridor. No wheezing or rales.  Abdominal:     General: Bowel sounds are normal. There is no distension.     Palpations: Abdomen is soft.     Tenderness: There is abdominal tenderness in the left lower quadrant. There is no guarding or rebound.  Musculoskeletal:        General: No tenderness or deformity.     Cervical  back: Neck supple.  Skin:    General: Skin is warm and dry.     Findings: No rash.  Neurological:     General: No focal deficit present.     Mental Status: She is alert.     Cranial Nerves: No cranial nerve deficit, dysarthria or facial asymmetry.     Sensory: No sensory deficit.     Motor: No abnormal muscle tone or seizure activity.     Coordination: Coordination normal.  Psychiatric:        Mood and Affect: Mood normal.     (all labs ordered are listed, but only abnormal results are displayed) Labs Reviewed  URINALYSIS, ROUTINE W REFLEX MICROSCOPIC - Abnormal; Notable for the following components:      Result Value   Leukocytes,Ua MODERATE (*)    All other components within normal limits  URINALYSIS, MICROSCOPIC (REFLEX) - Abnormal; Notable for the following components:    Bacteria, UA RARE (*)    All other components within normal limits  COMPREHENSIVE METABOLIC PANEL WITH GFR  LIPASE, BLOOD  CBC WITH DIFFERENTIAL/PLATELET    EKG: None  Radiology: No results found.   Procedures   Medications Ordered in the ED  sodium chloride  0.9 % bolus 1,000 mL (1,000 mLs Intravenous New Bag/Given 02/05/24 1056)  ondansetron  (ZOFRAN ) injection 4 mg (4 mg Intravenous Given 02/05/24 1052)  ketorolac  (TORADOL ) 15 MG/ML injection 15 mg (15 mg Intravenous Given 02/05/24 1053)    Clinical Course as of 02/05/24 1136  Fri Feb 05, 2024  1131 Urinalysis, Routine w reflex microscopic -Urine, Clean Catch(!) Urinary cystitis show 6-10 white styles rare bacteria CBC metabolic panel normal.  Lipase normal [JK]    Clinical Course User Index [JK] Randol Simmonds, MD                                 Medical Decision Making Problems Addressed: Left lower quadrant abdominal pain: acute illness or injury that poses a threat to life or bodily functions Lower urinary tract infectious disease: acute illness or injury that poses a threat to life or bodily functions  Amount and/or Complexity of Data Reviewed Labs: ordered. Decision-making details documented in ED Course.  Risk Prescription drug management.   Patient presented to the ED with complaints of lower abdominal pain.  Consider the possibility of diverticulitis colitis urinary tract infection pyelonephritis.  Patient with mild tenderness in the left lower abdomen.  No rebound or guarding.  No peritoneal signs.  Laboratory tests are reassuring.  No signs of hepatitis pancreatitis.  No leukocytosis.  No anemia.  Patient's urinalysis does show 6-10 white blood cells rare bacteria.  Discussed possible UTI finding with patient.  Also discussed the possibility of diverticulitis as a cause of her symptoms.  Discussed proceeding with CT scan imaging while in the ED versus trial antibiotics and close follow-up since the urinalysis does  suggest possible infection.  Patient opted for antibiotic regimen at this time.  Feel this is reasonable she does not have any peritoneal signs no guarding.  Patient instructed return if she starts having fevers chills vomiting or other concerning symptoms     Final diagnoses:  Left lower quadrant abdominal pain  Lower urinary tract infectious disease    ED Discharge Orders          Ordered    ciprofloxacin  (CIPRO ) 500 MG tablet  2 times daily        02/05/24  1135    metroNIDAZOLE  (FLAGYL ) 500 MG tablet  2 times daily        02/05/24 1135               Randol Simmonds, MD 02/05/24 1137
